# Patient Record
Sex: Female | Born: 1985
Health system: Western US, Academic
[De-identification: ages and names within clinical notes are randomized; demographics above are authoritative.]

---

## 2016-12-21 ENCOUNTER — Ambulatory Visit: Payer: BLUE CROSS/BLUE SHIELD

## 2016-12-23 ENCOUNTER — Ambulatory Visit: Payer: BLUE CROSS/BLUE SHIELD

## 2017-01-05 ENCOUNTER — Telehealth: Payer: BLUE CROSS/BLUE SHIELD

## 2017-01-06 ENCOUNTER — Telehealth: Payer: BLUE CROSS/BLUE SHIELD

## 2017-01-06 ENCOUNTER — Ambulatory Visit: Payer: BLUE CROSS/BLUE SHIELD

## 2017-01-07 ENCOUNTER — Ambulatory Visit: Payer: BLUE CROSS/BLUE SHIELD | Attending: Rheumatology

## 2017-02-05 ENCOUNTER — Ambulatory Visit: Payer: BLUE CROSS/BLUE SHIELD | Attending: Rheumatology

## 2017-02-06 ENCOUNTER — Telehealth: Payer: BLUE CROSS/BLUE SHIELD

## 2017-02-09 ENCOUNTER — Ambulatory Visit: Payer: BLUE CROSS/BLUE SHIELD | Attending: Rheumatology

## 2017-02-09 DIAGNOSIS — M3501 Sicca syndrome with keratoconjunctivitis: Secondary | ICD-10-CM

## 2017-02-09 DIAGNOSIS — G909 Disorder of the autonomic nervous system, unspecified: Secondary | ICD-10-CM

## 2017-02-09 MED ORDER — HYDROXYCHLOROQUINE SULFATE 200 MG PO TABS
200 mg | ORAL_TABLET | Freq: Every day | ORAL | 2 refills | Status: AC
Start: 2017-02-09 — End: 2018-10-12

## 2017-02-10 ENCOUNTER — Telehealth: Payer: BLUE CROSS/BLUE SHIELD

## 2017-02-12 ENCOUNTER — Telehealth: Payer: BLUE CROSS/BLUE SHIELD

## 2017-02-23 ENCOUNTER — Telehealth: Payer: BLUE CROSS/BLUE SHIELD

## 2017-02-25 ENCOUNTER — Ambulatory Visit: Payer: BLUE CROSS/BLUE SHIELD

## 2017-03-04 ENCOUNTER — Ambulatory Visit: Payer: BLUE CROSS/BLUE SHIELD | Attending: Rheumatology

## 2017-03-04 ENCOUNTER — Ambulatory Visit: Payer: BLUE CROSS/BLUE SHIELD

## 2017-03-04 DIAGNOSIS — S46811S Strain of other muscles, fascia and tendons at shoulder and upper arm level, right arm, sequela: Secondary | ICD-10-CM

## 2017-03-04 DIAGNOSIS — M549 Dorsalgia, unspecified: Secondary | ICD-10-CM

## 2017-03-04 DIAGNOSIS — S29012S Strain of muscle and tendon of back wall of thorax, sequela: Secondary | ICD-10-CM

## 2017-03-04 DIAGNOSIS — M542 Cervicalgia: Secondary | ICD-10-CM

## 2017-03-04 DIAGNOSIS — M25519 Pain in unspecified shoulder: Secondary | ICD-10-CM

## 2017-03-09 ENCOUNTER — Ambulatory Visit: Payer: BLUE CROSS/BLUE SHIELD

## 2017-03-09 ENCOUNTER — Telehealth: Payer: BLUE CROSS/BLUE SHIELD

## 2017-03-11 ENCOUNTER — Ambulatory Visit: Payer: BLUE CROSS/BLUE SHIELD

## 2017-03-11 DIAGNOSIS — M542 Cervicalgia: Secondary | ICD-10-CM

## 2017-03-11 DIAGNOSIS — F439 Reaction to severe stress, unspecified: Secondary | ICD-10-CM

## 2017-03-11 DIAGNOSIS — S46812S Strain of other muscles, fascia and tendons at shoulder and upper arm level, left arm, sequela: Secondary | ICD-10-CM

## 2017-03-11 DIAGNOSIS — M7918 Myalgia, other site: Secondary | ICD-10-CM

## 2017-03-11 DIAGNOSIS — S29012S Strain of muscle and tendon of back wall of thorax, sequela: Secondary | ICD-10-CM

## 2017-03-11 DIAGNOSIS — M549 Dorsalgia, unspecified: Secondary | ICD-10-CM

## 2017-03-11 DIAGNOSIS — S161XXS Strain of muscle, fascia and tendon at neck level, sequela: Secondary | ICD-10-CM

## 2017-03-11 DIAGNOSIS — M25519 Pain in unspecified shoulder: Secondary | ICD-10-CM

## 2017-03-18 ENCOUNTER — Ambulatory Visit: Payer: BLUE CROSS/BLUE SHIELD

## 2017-03-19 ENCOUNTER — Ambulatory Visit: Payer: BLUE CROSS/BLUE SHIELD

## 2017-03-19 ENCOUNTER — Telehealth: Payer: BLUE CROSS/BLUE SHIELD

## 2017-03-19 DIAGNOSIS — S39012A Strain of muscle, fascia and tendon of lower back, initial encounter: Secondary | ICD-10-CM

## 2017-03-19 DIAGNOSIS — M25519 Pain in unspecified shoulder: Secondary | ICD-10-CM

## 2017-03-19 DIAGNOSIS — S46911S Strain of unspecified muscle, fascia and tendon at shoulder and upper arm level, right arm, sequela: Secondary | ICD-10-CM

## 2017-03-19 DIAGNOSIS — S161XXS Strain of muscle, fascia and tendon at neck level, sequela: Secondary | ICD-10-CM

## 2017-03-19 DIAGNOSIS — F439 Reaction to severe stress, unspecified: Secondary | ICD-10-CM

## 2017-03-19 DIAGNOSIS — M542 Cervicalgia: Secondary | ICD-10-CM

## 2017-03-19 MED ORDER — IBUPROFEN 600 MG PO TABS
600 mg | ORAL_TABLET | Freq: Two times a day (BID) | ORAL | 1 refills | Status: AC | PRN
Start: 2017-03-19 — End: ?

## 2017-03-20 ENCOUNTER — Telehealth: Payer: BLUE CROSS/BLUE SHIELD

## 2017-03-23 ENCOUNTER — Ambulatory Visit: Payer: BLUE CROSS/BLUE SHIELD

## 2017-03-23 DIAGNOSIS — Z01419 Encounter for gynecological examination (general) (routine) without abnormal findings: Secondary | ICD-10-CM

## 2017-03-25 ENCOUNTER — Telehealth: Payer: BLUE CROSS/BLUE SHIELD

## 2017-03-26 ENCOUNTER — Ambulatory Visit: Payer: BLUE CROSS/BLUE SHIELD

## 2017-03-27 ENCOUNTER — Ambulatory Visit: Payer: BLUE CROSS/BLUE SHIELD

## 2017-03-27 DIAGNOSIS — M3501 Sicca syndrome with keratoconjunctivitis: Secondary | ICD-10-CM

## 2017-03-27 DIAGNOSIS — S29012S Strain of muscle and tendon of back wall of thorax, sequela: Secondary | ICD-10-CM

## 2017-03-27 DIAGNOSIS — R5383 Other fatigue: Secondary | ICD-10-CM

## 2017-03-27 DIAGNOSIS — M549 Dorsalgia, unspecified: Secondary | ICD-10-CM

## 2017-03-27 DIAGNOSIS — S46812S Strain of other muscles, fascia and tendons at shoulder and upper arm level, left arm, sequela: Secondary | ICD-10-CM

## 2017-03-27 DIAGNOSIS — F439 Reaction to severe stress, unspecified: Secondary | ICD-10-CM

## 2017-03-31 ENCOUNTER — Ambulatory Visit: Payer: BLUE CROSS/BLUE SHIELD

## 2017-04-15 ENCOUNTER — Ambulatory Visit: Payer: BLUE CROSS/BLUE SHIELD | Attending: Rheumatology

## 2017-05-04 ENCOUNTER — Ambulatory Visit: Payer: BLUE CROSS/BLUE SHIELD | Attending: Rheumatology

## 2017-05-06 ENCOUNTER — Telehealth: Payer: BLUE CROSS/BLUE SHIELD

## 2017-05-06 ENCOUNTER — Ambulatory Visit: Payer: BLUE CROSS/BLUE SHIELD | Attending: Rheumatology

## 2017-05-06 DIAGNOSIS — M3501 Sicca syndrome with keratoconjunctivitis: Secondary | ICD-10-CM

## 2017-05-15 DIAGNOSIS — F439 Reaction to severe stress, unspecified: Secondary | ICD-10-CM

## 2017-05-15 DIAGNOSIS — M2550 Pain in unspecified joint: Secondary | ICD-10-CM

## 2017-05-18 NOTE — Telephone Encounter
Message left on mobile number claim completed.

## 2017-06-03 ENCOUNTER — Ambulatory Visit: Payer: BLUE CROSS/BLUE SHIELD | Attending: Rheumatology

## 2017-08-05 ENCOUNTER — Ambulatory Visit: Payer: BLUE CROSS/BLUE SHIELD

## 2017-08-05 ENCOUNTER — Telehealth: Payer: BLUE CROSS/BLUE SHIELD

## 2017-08-05 NOTE — Telephone Encounter
This message was documented and routed to the office assigned pool during [facility] hours.     The patient or caller has been notified that this message was sent outside of normal office hours. yes    Patient or caller has been informed to please call us back if symptoms progress. No    [  ] Patient or caller has been warm transferred to the answering service/page operator.No    Good Morning Dr.Kaldas,    Patient's disability will expire Jan.2nd, however she currently has no insurance + has a large bill with [facility] and therefore is trying to avoid another visit.    Her father Passed in [place] and during that time she had surgery at [facility] due to Broken Left Hand.  She's been applying for employment that fits her needs and has not been successful + she will have insurance coverage till February 1st thru [employer] [place].    She would like to know if you could Extend her disability at this time Vs having to come in for another Doctor Appointment ??    Please Advise/letty

## 2017-08-05 NOTE — Telephone Encounter
Please advise 

## 2017-08-05 NOTE — Telephone Encounter
Fyi - Dr. Elmarie ShileyKaldas

## 2017-08-05 NOTE — Telephone Encounter
Appointment Accommodation Request    MD Name: Dr. Elmarie ShileyKaldas    Appointment Type: return    Reason for sooner request: regarding her total three patient email that was sent out today (08/05/2017), requesting to set up an appt for Medical disability  Pt would like to know if pt can be seen with Dr. Elmarie ShileyKaldas or Dr. Terance Icekookootsedes in the beginning of January    Date/Time Requested (If any): asap in January    Last seen by MD: 05/06/2017    Any Symptoms:  []  Yes  [x]  No      ? If yes, what symptoms are you experiencing:   o Duration of symptoms (how long):     Patient was offered an appointment but declined.    Patient was advised to seek emergency services if conditions are urgent or emergent.    Patient has been notified of the 24-48 hour turnaround time.

## 2017-08-05 NOTE — Telephone Encounter
Reply by: Lutricia HorsfallMarian Jera Headings  Called patient and left message that need to discuss with her symptoms and ensure we have all that is needed to extend it.

## 2017-08-06 ENCOUNTER — Ambulatory Visit: Payer: BLUE CROSS/BLUE SHIELD

## 2017-08-07 NOTE — Telephone Encounter
Please advise, see note below. Thank you

## 2017-08-07 NOTE — Telephone Encounter
Patient calling in regards to her diability, she is to speak with Dr,. Elmarie ShileyKaldas but she has missed the calls. Patient is available this morning, can doctor please call her back, thanks

## 2017-08-08 NOTE — Telephone Encounter
Reply by: Lutricia HorsfallMarian Ansen Sayegh  Will extend

## 2017-08-08 NOTE — Telephone Encounter
Reply by: Lutricia HorsfallMarian Kaleen Jensen  Called again and mailbox is full

## 2017-08-12 NOTE — Telephone Encounter
Noted, thank you

## 2017-08-20 ENCOUNTER — Ambulatory Visit: Payer: BLUE CROSS/BLUE SHIELD

## 2017-08-20 ENCOUNTER — Ambulatory Visit: Payer: BLUE CROSS/BLUE SHIELD | Attending: Foot & Ankle Surgery

## 2017-08-20 DIAGNOSIS — M549 Dorsalgia, unspecified: Secondary | ICD-10-CM

## 2017-08-20 DIAGNOSIS — F439 Reaction to severe stress, unspecified: Secondary | ICD-10-CM

## 2017-08-20 DIAGNOSIS — Z8781 Personal history of (healed) traumatic fracture: Secondary | ICD-10-CM

## 2017-08-20 DIAGNOSIS — M25519 Pain in unspecified shoulder: Secondary | ICD-10-CM

## 2017-08-20 DIAGNOSIS — M542 Cervicalgia: Secondary | ICD-10-CM

## 2017-08-21 NOTE — Progress Notes
[facility] Progress Note   Patient: Vanessa Jensen MRN: [id number] Date of Birth: 05-21-1986 Date of Service: 08/20/2017   Referring Provider: Corliss Marcus, MD, MS Primary Care Provider: Pcp, No, MD  Chief Complaint   Neck Pain    History of Present Illness   Vanessa Jensen is a 32 y.o. female who presents with Neck Pain    Interval history  08/20/17      Neck shoulder pain  Context of significant stress  Father passed aware in Sept, had to find way to pay for father's cremation  Broke left wrist in October, had to undergo surgery at [facility]  Tension built up in neck and shoulder    03/27/17  Right mid back pain, better with cupping, 5/10 pain today  Left shoulder pain  Left low back pain, 4-5/10, stiffness  Leg swelling worse at end of day  THC for sleep, but doesn't achieve deep sleep  Got posture corrector, also chrysanthemum tea    Initial intake 05/29/16  Fatigue, energy level low  Even with good sleep, 8h/night, less than 5/10  Context of insomnia for years, stopped trazadone  Uses THC, smoked, which helps  ???  Headaches since high school, saw neurologist at the time, but could not figure out the cause, diagnosed with chronic mild migraines, brain imaging negative  Since a month ago, headaches more severe, temporal, currently 4/10. Can be severe up to 8-9/10  Sharp pain, not so much throbbing  Occurring in context of neck pain  ???  Neck pain, assoc stiffness, worse over past month  Had TPI through immunologist, which helped, best relief for a couple days  Headaches improved a bit after neck pain improved  ???  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 Asthma    ??? CIN III (cervical intraepithelial neoplasia III)     s/p LEEP 12/16   ??? Depression with anxiety     prior suicide attempt   ??? History of alcohol abuse     h/o alchohol withdrawls   ??? History of eating disorder    ??? History of pancreatitis 2001   ??? History of substance abuse    ??? Insomnia    ??? Migraines    ??? Sjogren's syndrome (HCC/RAF)    ??? Urticaria      Patient Active Problem List    Diagnosis Date Noted   ??? Sjogren's syndrome (HCC/RAF) 10/08/2016     Outpatient Medications Prior to Visit   Medication Sig   ??? albuterol 90 mcg/act inhaler Take 2 puffs by nebulization.   ??? beclomethasone 40 mcg/act inhaler Take 2 puffs by nebulization.   ??? cetirizine 5 mg tablet    ??? etonogestrel-ethinyl estradiol (NUVARING) 0.12-0.015 mg/24 hr vaginal ring One ring x 3 weeks.  Remove for one week, then insert new ring and repeat cycle.   ??? fluoxetine 20 mg capsule TAKE ONE CAOSULE BY MOUTH EVERY MORNING   ???  hydroxychloroquine (PLAQUENIL) 200 mg tablet Take 1 tablet (200 mg total) by mouth daily.   ??? HYDROXYCHLOROQUINE 200 mg tablet TAKE 1 TABLET BY MOUTH EVERY DAY   ??? hydroxyzine 25 mg tablet    ??? loratadine-pseudoephedrine 10-240 mg tablet Take 1 tablet by mouth daily.   ??? lorazepam 0.5 mg tablet TAKE 1 TABLET BY MOUTH EVERY 6 HOURS AS NEEDED ANXIETY   ??? ondansetron 4 mg tablet Take 1 tablet (4 mg total) by mouth every six (6) hours as needed for Nausea.   ??? PATADAY 0.2 % ophthalmic solution    ??? trazodone 50 mg tablet TAKE 1 TO 3 TABLETS BY MOUTH AT BEDTIME AS NEEDED FOR INSOMNIA.     No facility-administered medications prior to visit.      Allergies     Allergies   Allergen Reactions   ??? Clindamycin    ??? Nitrofurantoin    ??? Tea      Green tea   ??? Fluconazole Hives   ??? Metronidazole Hives ??? Sulfa Antibiotics Hives     Review of Systems   Const:    GI:    Musculo:  Neck shoulder mid back pain  Skin:    Neuro:    Psych:  stress  Physical Examination   BP 115/74  ~ Pulse 81  ~ Temp 36.4 ???C (97.6 ???F) (Temporal)  ~ Ht 5' 3'' (1.6 m)  ~ Wt 118 lb (53.5 kg)  ~ BMI 20.90 kg/m???   General: Well-appearing, no distress.  Eyes: Eyelids normal. Conjunctiva normal.   ENMT: External ears and nose normal.   Neck: Neck symmetric.    Cardiovasc: No lower extremity edema.   Musculo: Tenderness to palpation as noted on annotated image.   Skin: Rashes none.  Psych: Mood and affect normal.  Trigger/Tender Point Identification: (see annotated image)        Laboratory studies / Diagnostics / Imaging   Obtained and reviewed old records from [facility].    Assessment / Plan   Vanessa Jensen is a 32 y.o. female with  1. Neck and shoulder pain  Acupuncture   2. Upper back pain  Acupuncture   3. Stress  Acupuncture   4. History of wrist fracture          Plan  #Neck shoulder pain  -Acupuncture today  -Recommend stretching, self massage  -TPI as needed    #Upper back pain  -Add MFR next visit    #Stress  -acupuncture  -stress mgt     #History of wrist fracture, left side    #Fatigue  -american ginseng tea  -consider melatonin for improved sleep quality    Reviewed treatments, and treatment benefits, risks, alternatives with patient. acupuncture and TPI as discussed.     Acupuncture Points:  (gb20 gb21 si15 ub11 si13 si11 ub15 ub18, R li4 li11 si5)   Acupuncture Treatment Duration: 20 Minutes                        Return in about 1 week (around 08/27/2017).     Corliss Marcus, MD, MS  08/20/2017 9:14 PM    There are no Patient Instructions on file for this visit.

## 2017-08-26 ENCOUNTER — Telehealth: Payer: BLUE CROSS/BLUE SHIELD

## 2017-08-26 NOTE — Telephone Encounter
Fully booked. Please advise on accomodation.

## 2017-08-26 NOTE — Telephone Encounter
Hi Dr. Elmarie Shiley, please see request.

## 2017-08-26 NOTE — Telephone Encounter
Call Back Request    MD:  Dr. Elmarie Shiley     Reason for call back:  Pt called to check status on extension for leave request pt had made via mychart. Please contact pt at earliest convenience.     Any Symptoms:  []  Yes  [x]  No      ? If yes, what symptoms are you experiencing:    o Duration of symptoms (how long):    o Have you taken medication for symptoms (OTC or Rx):      Patient or caller has been notified of the 24-48 hour turnaround time.

## 2017-08-26 NOTE — Telephone Encounter
Appointment Accommodation Request    MD Name:Kaldas    Appointment Type: return    Reason for sooner request:flare-up    Date/Time Requested (If any):     Last seen by MD:     Any Symptoms:  [x]  Yes  []  No      ? If yes, what symptoms are you experiencing:  Stiffness/joint pain  o Duration of symptoms (how long):     Patient was offered an appointment but declined.    Patient was advised to seek emergency services if conditions are urgent or emergent.    Patient has been notified of the 24-48 hour turnaround time.    CBN: [phone]

## 2017-08-27 ENCOUNTER — Ambulatory Visit: Payer: BLUE CROSS/BLUE SHIELD

## 2017-08-27 NOTE — Telephone Encounter
Reply by: Laquasia Pincus  done

## 2017-08-27 NOTE — Telephone Encounter
Reply by: Lutricia Horsfall  Spoke to patient. Can keep on cancellation list but ok with current appt.

## 2017-08-28 ENCOUNTER — Ambulatory Visit: Payer: BLUE CROSS/BLUE SHIELD

## 2017-09-08 ENCOUNTER — Ambulatory Visit: Payer: BLUE CROSS/BLUE SHIELD | Attending: Rheumatology

## 2017-09-08 DIAGNOSIS — M3501 Sicca syndrome with keratoconjunctivitis: Secondary | ICD-10-CM

## 2017-09-08 NOTE — Progress Notes
PATIENT: Vanessa Jensen  MRN: [id number]  DOB: [birth year]  DATE OF SERVICE: 09/08/2017  CHIEF COMPLAINT:   Chief Complaint   Patient presents with   ??? Sjogren's syndrome with keratoconjunctivitis sicca (HCC/RAF)   ??? Body Pain   ??? Joint Pain     all joints turned bright red   ??? Muscle Pain   ??? Urticaria   ??? Rash      HPI   Vanessa Jensen is a 32 y.o. female presents for the following:    09/08/17:  Joints have been aching. Had a burning redness from ankles to both knees with burning sensation.   Has been very stressed with significant life changes. Still with left wrist pain that is persistent.   Going to see specialist on Thursday. Mouth and eyes are very dry.   Integrative health coaching nutrition class online that is pretty mellow.   Recent pred taper helped a little bit.   Getting feet mris done soon.       05/06/17:  When walks on right foot feels neuropathic pain, has pain in lower back, pain in joints in hands, bilateral elbows. Feels very fatigued. Also father died last week and stress is really affecting her energy level as well. Has to push herself. At first no sleep and the last few nights was unable to get out of bed. Had recent viral infection.   10 point ROS reviewed.     03/04/17:  Saw Dr. 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 Was experiencing getting cold spells and if has chills. Gets to a point where can't breathe or talk and usually lasts about 30seconds and then resolve.   Now has been experiencing severe fatigue, dry eyes, and dry mouth. Has pain in extremities with a throb in the in distal extremities. Was taking green tea with matcha and has allergy to it.     10/08/16:   Recovered from flu and cold. Feeling a little bit better. Still with some fatigue.   10 point ROS otherwise unchanged.     08/27/16:  Has gotten the flu and a cold and has been under a lot of stress. Still with fatigue and joint pains in various areas. Still gets hives on and off. Back is uncomfortable. Lidocaine is helping with the muscle spasm.     At last visit:   Has been getting headaches since started up the prozac again. Still pretty fatigued. Coffee was making her feel bad so went to low acid coffee.     At last visit:   Got laid off while on depression leave. Feels exhausted all the time. Tried the plaquenil for about 3 weeks. The joint pains hit the knees hard sometimes. No prior injuries to knees. Biggest symptoms is the fatigue. Has been breaking out in hives. Gets blisters all over that are itchy and pop with itching. Took benadryl and it got better. Had them for about 2 months. She is not sure if there is a trigger for this.     At last visit:   Still with fatigue. Arthralgias. Feels a fog. Intermittent red rashes in sun.  Has sicca symptoms.       Initial HPI:   Started to get recurrent yeast infections with lip swelling with bleeding, cracking hives.   For past two months. Lethargic, hives on arms, headaches, dizzy spells; nausea, night sweats.   Saw immunologist and was tested for sjogren's and told has  positive ab for this. Get's raynaud's.   Feels fatigued; has insomnia (x15 years); has dry eyes; Feeling very stiff lately.  Used to drink a lot of alcohol and now cannot tolerate it. Sicca symptoms     Had recent injuries in wrist (ulnar dislocation); right wrist tendon torn; left foot collapsed arch; right foot broke toe.    Has a lot of stress in life;         PMH   Insomnia  Asthma  Pancreatitis at 14;  HPV    PSxH   Had recent injuries in wrist (ulnar dislocation); right wrist tendon torn; left foot collapsed arch; right foot broke toe.    LEEP 12/17    ALL     Allergies   Allergen Reactions   ??? Clindamycin    ??? Nitrofurantoin    ??? Tea      Green tea   ??? Fluconazole Hives   ??? Metronidazole Hives   ??? Sulfa Antibiotics Hives     MEDS     Medications that the patient states to be currently taking Medication Sig   ??? albuterol 90 mcg/act inhaler Take 2 puffs by nebulization.   ??? beclomethasone 40 mcg/act inhaler Take 2 puffs by nebulization.   ??? cetirizine 5 mg tablet    ??? doxycycline 100 mg tablet TAKE 1 TABLET BY MOUTH TWICE A DAY FOR 10 DAYS   ??? ibuprofen 200 mg tablet Take 200 mg by mouth every six (6) hours as needed.   ??? ondansetron 4 mg tablet Take 1 tablet (4 mg total) by mouth every six (6) hours as needed for Nausea.   ??? PATADAY 0.2 % ophthalmic solution        [facility]     Family History   Problem Relation Age of Onset   ??? Crohn's disease Sister         half sister     Social History   Substance Use Topics   ??? Smoking status: Current Every Day Smoker   ??? Smokeless tobacco: Never Used   ??? Alcohol use Not on file       ROS   (Check if Normal, or note + findings):     [x]  reviewed questionnaire on 09/08/2017    []  Not Obtainable:   []  Constit  []  Skin    []  Eyes  []  CV   []  MS    []  ENT  []  GI  []  Heme/Lymph     []  Resp  []  GU  []  Neuro    []  Imm/All   []  Endo  []  Psych     PHYSICAL EXAM   Check if Normal, or note + findings  Vit BP 110/62  ~ Pulse 65  ~ Temp 36.6 ???C (97.9 ???F)  ~ Ht 5' 3'' (1.6 m)  ~ Wt 121 lb (54.9 kg)  ~ SpO2 98%  ~ BMI 21.43 kg/m???    Gen [x]  NAD  GI []  Abd  []  Rect []  HSM   Eyes [x]  Conj/Lids []  Pupils  []  Masses []  Guard/Rebnd   ENT []  Ears/otosc    []  Oroph   []  Nasal Muc  []  Hearing GU:Fem  ZO:XWRU []  Ext   []  Vag      []  Pen  []  Scro    []  Cerv []  Uter/Ad []  Deferred  []  Prost []  Deferred   Neck []  Inspect/Palp   []  Thyroid Neuro [x]  A&O []  CN2-12   Breast []  Inspect   []   Palpation  []  DTR []  Motor/Sen   Resp [x]  Effort [x]  Ascult []  Percuss MSK [x]  Gait   [x]  ROM  []  Tone  []  Bal []  Insp/palp  []  Back   CV [x]  Auscultate []  Carotids       [x]  Edema Puls: []  Pedal []  Fem  Skin [x]  Inspect  Healed scar on lateral part of wrist. Ventral wrist and forearm scar.  []  Palp   Lymph [x]  Neck  []  Axillary  []   Femoral Psych [x]  Insight/judg  []  Affect []  Cog      28 joints examined ~~~~~~~~~~~~~~~~~~~~~~~~~~~~~~~~~~~~~~~~~~~~~~~~~~~~~~~~  RIGHT  Tender Joint Count:   LEFT   TMJ []   ~ []  TMJ   Shoulder []  ~ []  Shoulder   Elbow []  ~ []  Elbow   Wrist []  ~ [x]  Wrist   CMC1 []  ~ []  CMC1  ~  []  MCP5 []  MCP4 []  MCP3 []  MCP2 []  MCP1 ~ []  MCP1 []  MCP2 []  MCP3 []  MCP4 []  MCP5  []  PIP5 []  PIP4 []  PIP3 []  PIP2 []  IP1 ~ []  IP1 []  PIP2 []  PIP3 []  PIP4 []  PIP5   []  DIP5 []  DIP4 []  DIP3 []  DIP2~  ~  []  DIP2 []  DIP3 []  DIP4 []  DIP5                 []  cervical spine                 []  thoracic spine                 []  lumbar spine      Sacroiliac  []  ~       []  Sacroiliac                Hip  []  ~       []  Hip                  Knee  []  ~       []  Knee        Ankle    []  ~       []  Ankle      []  MTP5 []  MTP4 []  MTP3 []  MTP2 []  MTP1 ~ []  MTP1 []  MTP2 []  MTP3 []  MTP4 []  MCP5  []  Toe5 []  Toe4 []  Toe3 []  Toe2 []  Toe1 ~ []  Toe1 []  Toe2 []  Toe3 []  Toe4 []  Toe5     ~~~~~~~~~~~~~~~~~~~~~~~~~~~~~~~~~~~~~~~~~~~~~~~~~~~~~~~~   RIGHT  Swollen Joint Count:   LEFT   TMJ []   ~ []  TMJ   Shoulder []  ~ []  Shoulder   Elbow []  ~ []  Elbow   Wrist []  ~ [x]  Wrist   CMC1 []  ~ []  CMC1  ~  []  MCP5 []  MCP4 []  MCP3 []  MCP2 []  MCP1 ~ []  MCP1 []  MCP2 []  MCP3 []  MCP4 []  MCP5  []  PIP5 []  PIP4 []  PIP3 []  PIP2 []  IP1 ~ []  IP1 []  PIP2 []  PIP3 []  PIP4 []  PIP5   []  DIP5 []  DIP4 []  DIP3 []  DIP2~  ~  []  DIP2 []  DIP3 []  DIP4 []  DIP5              Sacroiliac  []  ~       []  Sacroiliac                                          Knee  []  ~       []   Knee       Ankle    []  ~       []  Ankle  []  MTP5 []  MTP4 []  MTP3 []  MTP2 []  MTP1 ~ []  MTP1 []  MTP2 []  MTP3 []  MTP4 []  MCP5  []  Toe5 []  Toe4 []  Toe3 []  Toe2 []  Toe1 ~ []  Toe1 []  Toe2 []  Toe3 []  Toe4 []  Toe5     Recent Labs and Test Results:     Lab Results   Component Value Date    CRP 0.3 02/09/2017    CRP <0.3 10/01/2016    SRWEST 9 02/09/2017    SRWEST 0 04/30/2016     Lab Results   Component Value Date    WBC 7.86 02/09/2017    HGB 13.7 02/09/2017    HCT 41.4 02/09/2017    PLT 311 02/09/2017 NEUTABS 5.52 02/09/2017    LYMPHABS 1.73 02/09/2017     Lab Results   Component Value Date    GLUCOSE 118 (H) 02/09/2017    CREAT 0.74 02/09/2017    CALCIUM 10.3 02/09/2017    TOTPRO 8.3 (H) 02/09/2017    ALBUMIN 4.9 02/09/2017    AST 26 02/09/2017    ALT 23 02/09/2017    ALKPHOS 48 02/09/2017     Lab Results   Component Value Date    CKTOT 87 04/30/2016     Lab Results   Component Value Date    C3 117 02/09/2017    C3 111 04/30/2016    C4 24 02/09/2017    C4 21 04/30/2016    DSDNAAB <=200 04/30/2016    DSDNAAB <=200 11/23/2015    NDNAABIFA <1:10 04/30/2016     No results found for: CANCA, PANCA, PROT3AB, MPOAB  No results found for: PROTCLUR, BLDUR, LEUKESTUR, RBCSUR, WBCSUR  Lab Results   Component Value Date    VITD25OH 48 08/27/2016    VITD25OH 74 (H) 04/30/2016     Outside labs reviewed  ANCA-neg  ANA IFA-neg  SSA-strong pos  ssb-neg      A&P     # sjogren's ab +, arthralgia, raynaud's, fatigue, low grade fevers and itching with intermittent hives.   -serologies-negative except for positive SSA.   - conservative management of dry eyes and dry mouth discussed  -sun avoidance  Labs today to follow up.   --referral to [facility] at last visit.     Continue plaquenil.   Finished steroid taper  OTC products for the dryness     Still would benefit from stress reduction and decreased physical stressors.     Follow up with hand specialist    Given ongoing work up for hand pain and foot discomfort as well as current flare and stressors think extension off work would be appropriate.        RTC 2 months    The above recommendation were discussed with the patient.  The patient has all questions answered satisfactorily and is in agreement with this recommended plan of care.  Author:  Lutricia Horsfall 09/08/2017 10:40 AM

## 2017-09-09 ENCOUNTER — Ambulatory Visit: Payer: BLUE CROSS/BLUE SHIELD

## 2017-09-09 LAB — Comprehensive Metabolic Panel
ALBUMIN: 4.2 g/dL (ref 3.9–5.0)
TOTAL PROTEIN: 6.9 g/dL (ref 6.1–8.2)

## 2017-09-09 LAB — Sedimentation Rate, Erythrocyte: SEDIMENTATION RATE, ERYTHROCYTE: 4 mm/h (ref <=25)

## 2017-09-09 LAB — PROTEIN/CREATININE RATIO, URINE: PROTEIN,URINE: <4 mg/dL (ref 0.0–0.4)

## 2017-09-09 LAB — C-Reactive Protein: C-REACTIVE PROTEIN: <0.3 mg/dL (ref <0.8)

## 2017-09-09 LAB — C4: C4: 21 mg/dL (ref 14–46)

## 2017-09-09 LAB — C3: C3: 94 mg/dL (ref 76–165)

## 2017-09-09 LAB — Pregnancy Test,Blood: PREGNANCY TEST,BLOOD: NEGATIVE

## 2017-09-09 LAB — Differential Automated: NEUTROPHIL PERCENT, AUTO: 57.3 (ref 0.00–0.04)

## 2017-09-09 LAB — CBC: MEAN PLATELET VOLUME: 10.7 fL (ref 9.3–13.0)

## 2017-09-09 NOTE — Progress Notes
PATIENT: Vanessa Jensen  MRN: [id number]  DOB: 1985-11-12  DATE OF SERVICE: 09/11/2017    PRIMARY CARE PROVIDER: Sharen Counter., MD    CHIEF COMPLAINT: No chief complaint on file.       Subjective:      Vanessa Jensen is a 32 y.o. female h/o as stated below p/w No chief complaint on file.   and the following current concerns.      Current Concerns:  ***    Chronic Problems:   Patient Active Problem List   Diagnosis   ??? Sjogren's syndrome (HCC/RAF)       Current Medications:   Current Outpatient Prescriptions   Medication Sig   ??? albuterol 90 mcg/act inhaler Take 2 puffs by nebulization.   ??? beclomethasone 40 mcg/act inhaler Take 2 puffs by nebulization.   ??? cetirizine 5 mg tablet    ??? doxycycline 100 mg tablet TAKE 1 TABLET BY MOUTH TWICE A DAY FOR 10 DAYS   ??? etonogestrel-ethinyl estradiol (NUVARING) 0.12-0.015 mg/24 hr vaginal ring One ring x 3 weeks.  Remove for one week, then insert new ring and repeat cycle.   ??? fluoxetine 20 mg capsule TAKE ONE CAOSULE BY MOUTH EVERY MORNING   ??? hydroxychloroquine (PLAQUENIL) 200 mg tablet Take 1 tablet (200 mg total) by mouth daily. (Patient not taking: Reported on 09/08/2017.)   ??? HYDROXYCHLOROQUINE 200 mg tablet TAKE 1 TABLET BY MOUTH EVERY DAY (Patient not taking: Reported on 09/08/2017)   ??? hydroxyzine 25 mg tablet    ??? ibuprofen 200 mg tablet Take 200 mg by mouth every six (6) hours as needed.   ??? loratadine-pseudoephedrine 10-240 mg tablet Take 1 tablet by mouth daily. (Patient not taking: Reported on 09/08/2017.)   ??? lorazepam 0.5 mg tablet TAKE 1 TABLET BY MOUTH EVERY 6 HOURS AS NEEDED ANXIETY   ??? ondansetron 4 mg tablet Take 1 tablet (4 mg total) by mouth every six (6) hours as needed for Nausea.   ??? PATADAY 0.2 % ophthalmic solution    ??? trazodone 50 mg tablet TAKE 1 TO 3 TABLETS BY MOUTH AT BEDTIME AS NEEDED FOR INSOMNIA.     No current facility-administered medications for this visit.         Allergies: Clindamycin; Nitrofurantoin; Tea; Fluconazole; Metronidazole; and Sulfa antibiotics    Past Surgical History:   Procedure Laterality Date   ??? CERVICAL BIOPSY  W/ LOOP ELECTRODE EXCISION  2006, 07/19/2015   ??? DILATION AND CURETTAGE OF UTERUS     ??? TOE SURGERY  08/2014   ??? WRIST SURGERY  03/2015    dislocated ulna, torn tendon       Family History   Problem Relation Age of Onset   ??? Crohn's disease Sister         half sister       Social History     Social History   ??? Marital status: Single     Spouse name: N/A   ??? Number of children: N/A   ??? Years of education: N/A     Occupational History   ??? Not on file.     Social History Main Topics   ??? Smoking status: Current Every Day Smoker   ??? Smokeless tobacco: Never Used   ??? Alcohol use Not on file   ??? Drug use: Yes     Types: Marijuana   ??? Sexual activity: Not on file     Other Topics Concern   ??? Not on file  Social History Narrative   ??? No narrative on file       Review of Systems: A 14-system review of systems was performed and is negative except as stated in the history of present illness.  Constitutional: negative for chills, fevers, night sweats and weight loss  ENT: negative bad breath  Eyes: negative discharge from eyes  Respiratory: negative for cough, dyspnea on exertion and wheezing  Cardiovascular: negative for chest pain, dyspnea, lower extremity edema and palpitations  GI: negative nausea, vomiting, diarrhea  GU: no urinary incontinence  MSK: negative muscle twitching  Integumentary: negative cracking of skin  Neurological: negative syncope or disorientation  Psychiatric: negative nervousness  Endocrine: negative excessive thirst  Heme/Lymph: negative enlarged lymph nodes  Allergic/Immunologic: negative hives       Objective:        There were no vitals taken for this visit.    General: WN,WD in NAD, cooperative  Head: NC/AT  Eyes: EOMI, conjunctiva clear  Ears: normal TMs and external ear canals both ears  Nose: nares normal, no drainage, sinus tenderness  Throat: OP without erythema, no exudates Neck: no LAD, supple, trachea midline, thyroid not enlarged, symmetric, no tenderness/mass/nodules  Lungs: CTAB, no w/r/r  Heart: RRR, S1 and S2 normal, no m/r/g  Abdomen: Soft, NT/ND, no organomegaly, +BS  Back: no midline TTP, no CVAT  Extremities: Symmetric, no c/c/e  Pulses: 2+ symmetric: dorsal pedis  Neuro: Grossly intact  Psychiatric: answers appropriately, mood and affect WNLs    Lab Review:   Office Visit on 09/08/2017   Component Date Value   ??? Sodium 09/08/2017 143    ??? Potassium 09/08/2017 4.5    ??? Chloride 09/08/2017 107*   ??? Total CO2 09/08/2017 23    ??? Anion Gap 09/08/2017 13    ??? Glucose 09/08/2017 95    ??? GFR Estimate for Non-Afr* 09/08/2017 >89    ??? GFR Estimate for African* 09/08/2017 >89    ??? GFR Additional Informati* 09/08/2017 See Comment    ??? Creatinine 09/08/2017 0.64    ??? Urea Nitrogen 09/08/2017 15    ??? Calcium 09/08/2017 9.1    ??? Total Protein 09/08/2017 6.9    ??? Albumin 09/08/2017 4.2    ??? Bilirubin,Total 09/08/2017 0.3    ??? Alkaline Phosphatase 09/08/2017 65    ??? Aspartate Aminotransfera* 09/08/2017 14    ??? Alanine Aminotransferase 09/08/2017 14    ??? Sedimentation rate, Eryt* 09/08/2017 4    ??? C-Reactive Protein 09/08/2017 <0.3    ??? Prot/Creat Ratio,Ur 09/08/2017     ??? Protein,Urine 09/08/2017 <4    ??? Creatinine,Random Urine 09/08/2017 56.9    ??? C3 09/08/2017 94    ??? C4 09/08/2017 21    ??? Pregnancy Test,Blood 09/08/2017 Negative    ??? White Blood Cell Count 09/08/2017 6.53    ??? Red Blood Cell Count 09/08/2017 3.64*   ??? Hemoglobin 09/08/2017 11.3*   ??? Hematocrit 09/08/2017 33.7*   ??? Mean Corpuscular Volume 09/08/2017 92.6    ??? Mean Corpuscular Hemoglo* 09/08/2017 31.0    ??? MCH Concentration 09/08/2017 33.5    ??? Red Cell Distribution Wi* 09/08/2017 43.6    ??? Red Cell Distribution Wi* 09/08/2017 13.1    ??? Platelet Count, Auto 09/08/2017 229    ??? Mean Platelet Volume 09/08/2017 10.7    ??? Nucleated RBC%, automated 09/08/2017 0.0    ??? Absolute Nucleated RBC C* 09/08/2017 0.00 ??? Neutrophil Abs (Prelim) 09/08/2017 3.74    ??? Neutrophil Percent, Auto  09/08/2017 57.3    ??? Lymphocyte Percent, Auto 09/08/2017 28.0    ??? Monocyte Percent, Auto 09/08/2017 10.1    ??? Eosinophil Percent, Auto 09/08/2017 3.7    ??? Basophil Percent, Auto 09/08/2017 0.6    ??? Immature Granulocytes% 09/08/2017 0.3    ??? Absolute Neut Count 09/08/2017 3.74    ??? Absolute Lymphocyte Count 09/08/2017 1.83    ??? Absolute Mono Count 09/08/2017 0.66    ??? Absolute Eos Count 09/08/2017 0.24    ??? Absolute Baso Count 09/08/2017 0.04    ??? Absolute Immature Gran C* 09/08/2017 0.02            Assessment & Plan:          32 y.o. old female  has a past medical history of Asthma; CIN III (cervical intraepithelial neoplasia III); Depression with anxiety; History of alcohol abuse; History of eating disorder; History of pancreatitis (2001); History of substance abuse; Insomnia; Migraines; Sjogren's syndrome (HCC/RAF); and Urticaria. had no chief complaint listed for this encounter., ***    There are no diagnoses linked to this encounter.          #Routine Health Maintenance:  Health Maintenance Topics with due status: Not Due       Topic Last Completion Date    Tdap/Td Vaccine 02/28/2014    Cervical Ca Screening: PAP Smear 03/23/2017     Health Maintenance Topics with due status: Completed / Addressed / Aged Out       Topic Last Completion Date    HIV Screening 03/23/2017       Health Maintenance Due   Topic Date Due   ??? Cervical Ca Screening: HPV Testing  11/09/2016   ??? Influenza Vaccine (1) 04/18/2017       Mammogram: No results found. n/a  Pap: Neg 03/23/17  Colon CA: n/a  Immunizations: Flu: *** Tdap: 2015 Pneumo n/a Zoster n/a  DEXA: n/a  Hx of Smoking/Abd Ultrasound  (M>65yo any smoking)/CTscan (M/F 30pack year and smoked past 15 yrs): n/a  HCV: n/a    Follow up:   No Follow-up on file.          Author:  Sharen Counter, MD 09/09/2017 1:16 PM      The above plan of care, diagnosis, orders, and follow-up were discussed with the patient.  Questions related to this recommended plan of care were answered.

## 2017-09-10 LAB — dsDNA Ab EIA: DSDNA AB EIA: <=200 [IU]/mL (ref <=200)

## 2017-09-11 ENCOUNTER — Ambulatory Visit: Payer: BLUE CROSS/BLUE SHIELD

## 2017-09-11 DIAGNOSIS — M76829 Posterior tibial tendinitis, unspecified leg: Secondary | ICD-10-CM

## 2017-09-12 ENCOUNTER — Inpatient Hospital Stay: Payer: BLUE CROSS/BLUE SHIELD

## 2017-09-12 LAB — dsDNA Ab IFA: NDNA (CRITHIDIA) AB IFA: <1:10 {titer}

## 2017-09-14 ENCOUNTER — Ambulatory Visit: Payer: BLUE CROSS/BLUE SHIELD | Attending: Rheumatology

## 2017-09-14 ENCOUNTER — Ambulatory Visit: Payer: BLUE CROSS/BLUE SHIELD

## 2017-09-24 ENCOUNTER — Ambulatory Visit: Payer: BLUE CROSS/BLUE SHIELD

## 2017-09-24 MED ORDER — ALBUTEROL SULFATE HFA 108 (90 BASE) MCG/ACT IN AERS
2 | Freq: Four times a day (QID) | RESPIRATORY_TRACT | 1 refills | Status: AC | PRN
Start: 2017-09-24 — End: 2017-12-03

## 2017-09-29 ENCOUNTER — Telehealth: Payer: BLUE CROSS/BLUE SHIELD

## 2017-09-29 ENCOUNTER — Ambulatory Visit: Payer: BLUE CROSS/BLUE SHIELD

## 2017-09-29 NOTE — Telephone Encounter
Message from [facility], please advise.     ''  I had STD testing done in August 2018 and I would like to check the details as soon as possible please. Please call me at [phone]

## 2017-09-29 NOTE — Telephone Encounter
Results Request - The patient would like to discuss the results of their recent tests.     Pt would like to discuss test results for labs taken back in 03/23/17. Pt recently had lab work done with different MD and was advised something came up in test (Pt did not want to disclose) and she would like to go over previous labs to pin point time frame.     1) Ordering MD: Genevieve Norlander    2) What type of test(s)? Labs    3) When was it performed? 03/23/17    4) Where was it performed?     If [facility], are results available in [facility]? Yes  If outside facility, what is their phone number?     Patient has been notified of the 24-48 hour turnaround time.

## 2017-09-30 ENCOUNTER — Ambulatory Visit: Payer: BLUE CROSS/BLUE SHIELD

## 2017-10-19 ENCOUNTER — Ambulatory Visit: Payer: BLUE CROSS/BLUE SHIELD | Attending: Hand Surgery

## 2017-10-24 IMAGING — CR DX Chest PA Lateral
1 series · 2 of 2 positions shown · non-contrast
Comparison: July 01, 2005

HISTORY: Cough
TECHNIQUE: Frontal and lateral chest radiographs.

[Series 1: pa · 0.17mm/px · 2 of 2 slices shown]
[im 1/2]
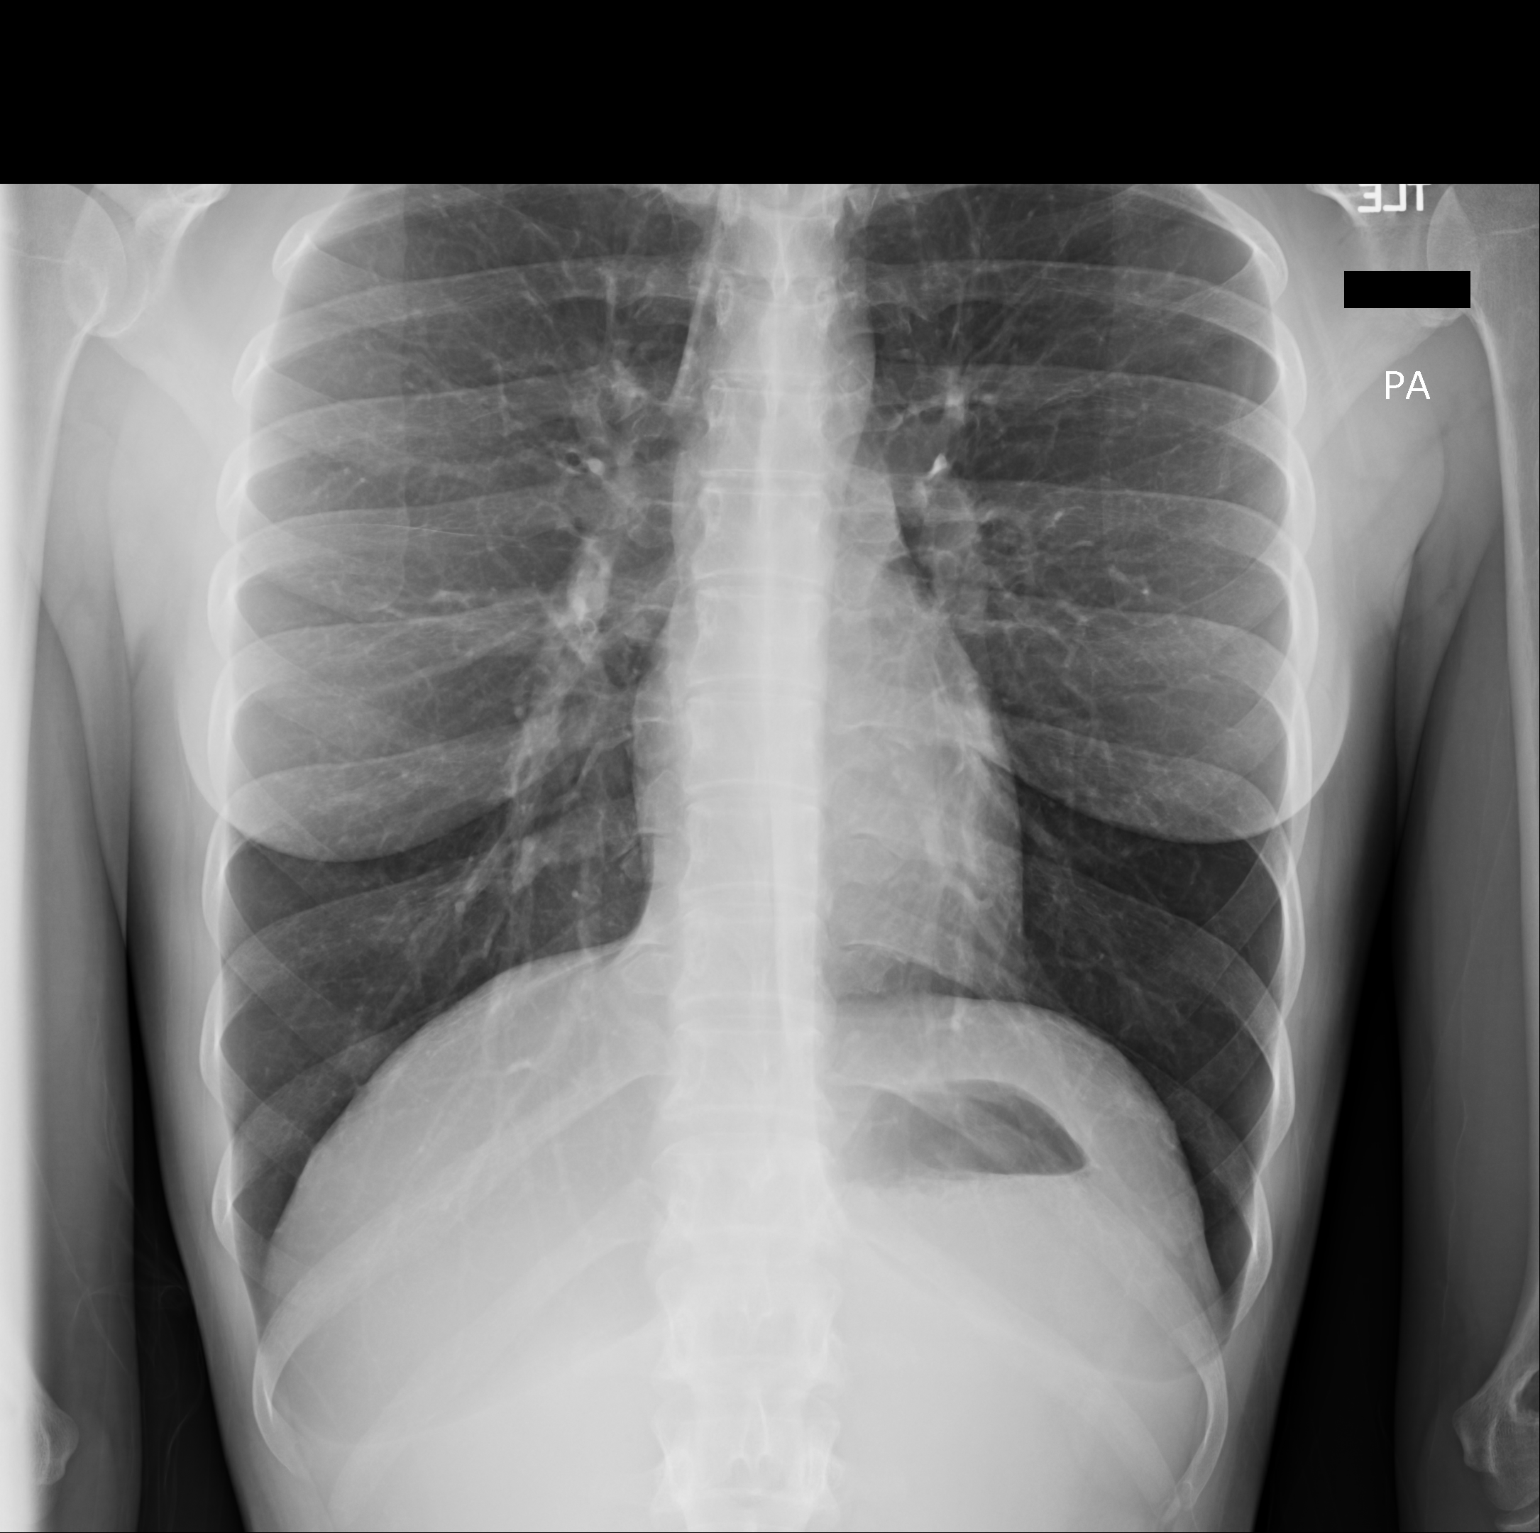
[im 2/2]
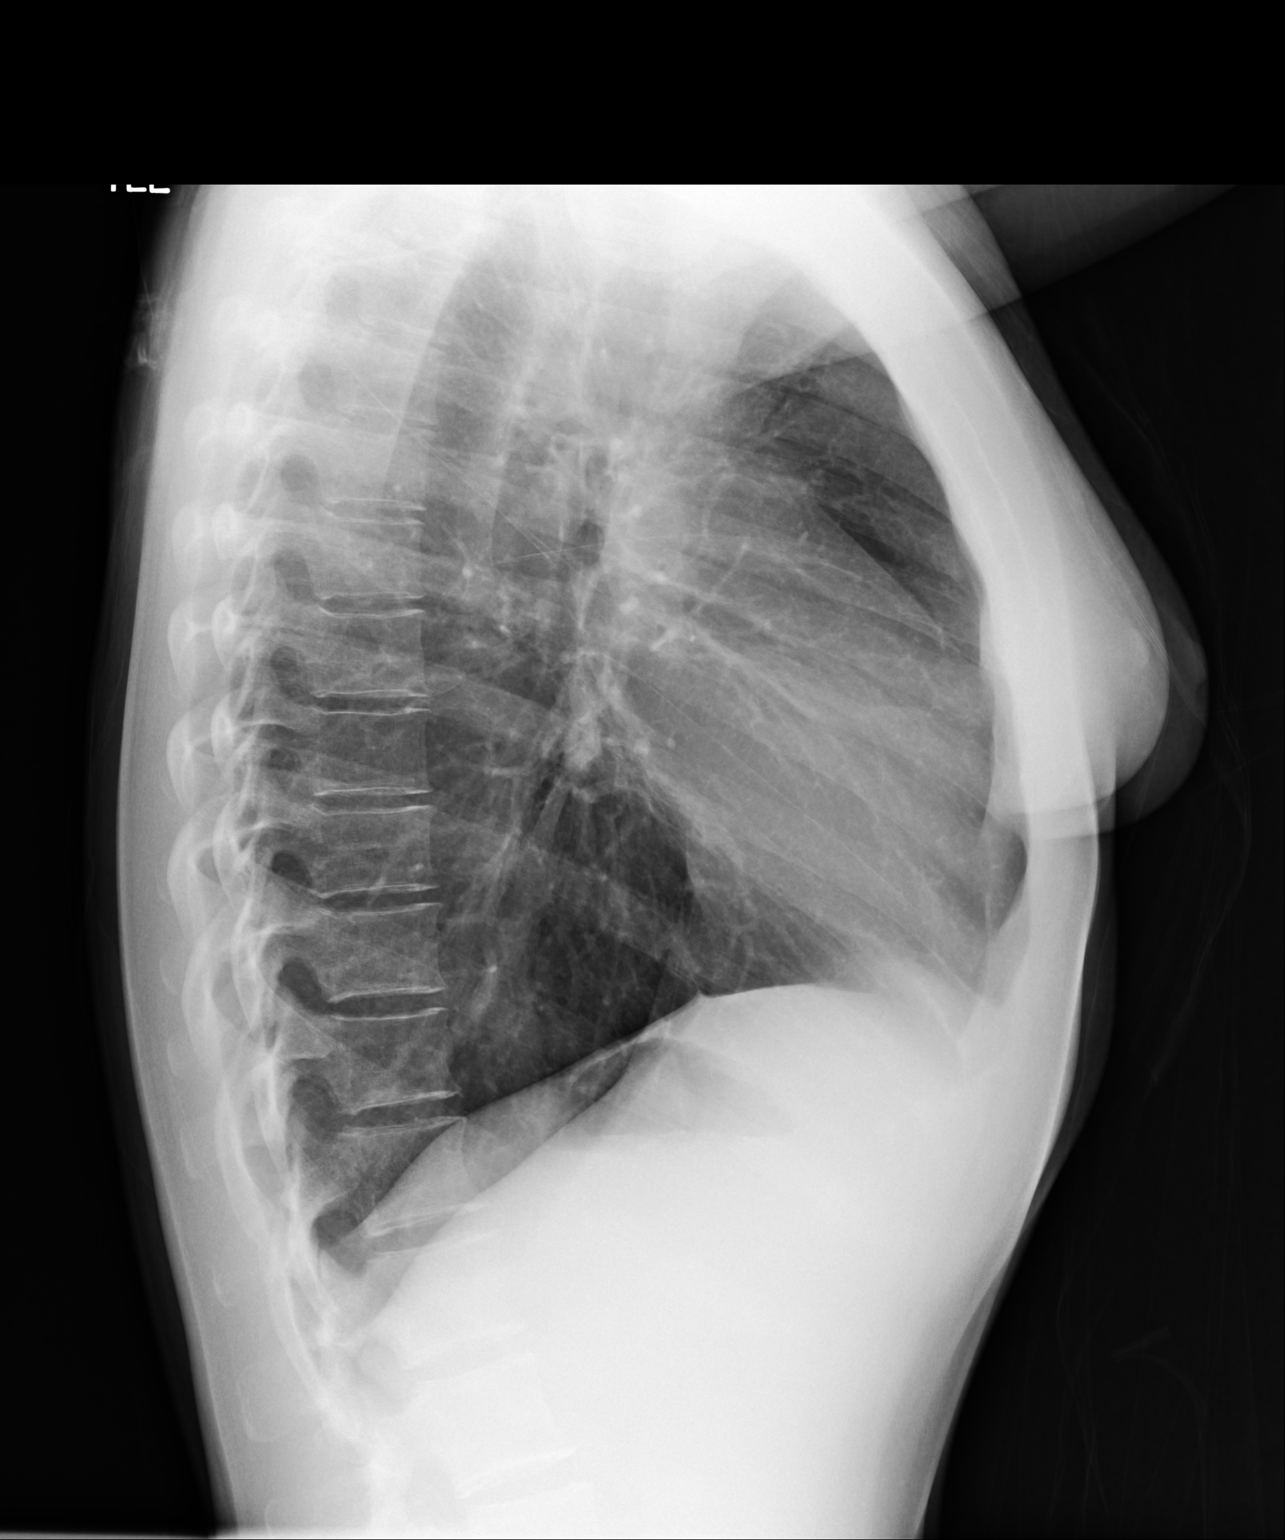

[2 of 2 positions shown; findings below may reference images not displayed]

FINDINGS: The cardiomediastinal and hilar silhouettes appear normal.  Lungs are adequately          
 expanded and clear without focal consolidation, pleural effusion,                         
 or                                                                                        
 pneumothorax.
IMPRESSION: 1.  No acute abnormality.

## 2017-10-28 ENCOUNTER — Inpatient Hospital Stay: Payer: BLUE CROSS/BLUE SHIELD | Attending: Hand Surgery

## 2017-10-28 ENCOUNTER — Ambulatory Visit: Payer: BLUE CROSS/BLUE SHIELD | Attending: Rheumatology

## 2017-10-28 DIAGNOSIS — M25831 Other specified joint disorders, right wrist: Secondary | ICD-10-CM

## 2017-10-28 MED ORDER — MELOXICAM 7.5 MG PO TABS
7.5 mg | ORAL_TABLET | Freq: Every day | ORAL | 2 refills | Status: AC
Start: 2017-10-28 — End: 2018-04-24

## 2017-10-28 NOTE — Patient Instructions
Sterling Big MA MFT  [address] [facility] [address], [place] [address]  License:   Phone:  Fax:   [place] [address]  [phone]  [phone]  [email or web address]

## 2017-10-28 NOTE — Progress Notes
PATIENT: Vanessa Jensen  MRN: [id number]  DOB: 14-Jan-1986  DATE OF SERVICE: 10/28/2017  CHIEF COMPLAINT:   Chief Complaint   Patient presents with   ??? Sjogren's syndrome with keratoconjunctivitis sicca (HCC/RAF)   ??? Joint Pain   ??? Arm Pain   ??? Fatigue   ??? Chest congestion      HPI   Vanessa Jensen is a 32 y.o. female presents for the following:    10/28/17:  Fatigued. Has been having difficulty sleeping. Grandmother died. Almost died drinking and went to detox at [facility]. Was placed on a lot of psychiatric meds. Was placed on keppra, ativan, gabapentin, abilify.   Currently in AA and doing it.    Staying with friend and wife now. Was never placed on inpatient.   Had recent respiratory infection and was just given prednisone for respiratory infection.   cbd drops currently. Left foot collapsed arch and it is very painful.         09/08/17:  Joints have been aching. Had a burning redness from ankles to both knees with burning sensation.   Has been very stressed with significant life changes. Still with left wrist pain that is persistent.   Going to see specialist on Thursday. Mouth and eyes are very dry.   Integrative health coaching nutrition class online that is pretty mellow.   Recent pred taper helped a little bit.   Getting feet mris done soon.       05/06/17:  When walks on right foot feels neuropathic pain, has pain in lower back, pain in joints in hands, bilateral elbows. Feels very fatigued. Also father died last week and stress is really affecting her energy level as well. Has to push herself. At first no sleep and the last few nights was unable to get out of bed. Had recent viral infection.   10 point ROS reviewed.     03/04/17:  Saw Dr. 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 Was experiencing getting cold spells and if has chills. Gets to a point where can't breathe or talk and usually lasts about 30seconds and then resolve. Now has been experiencing severe fatigue, dry eyes, and dry mouth. Has pain in extremities with a throb in the in distal extremities. Was taking green tea with matcha and has allergy to it.     10/08/16:   Recovered from flu and cold. Feeling a little bit better. Still with some fatigue.   10 point ROS otherwise unchanged.     08/27/16:  Has gotten the flu and a cold and has been under a lot of stress. Still with fatigue and joint pains in various areas. Still gets hives on and off. Back is uncomfortable. Lidocaine is helping with the muscle spasm.     At last visit:   Has been getting headaches since started up the prozac again. Still pretty fatigued. Coffee was making her feel bad so went to low acid coffee.     At last visit:   Got laid off while on depression leave. Feels exhausted all the time. Tried the plaquenil for about 3 weeks. The joint pains hit the knees hard sometimes. No prior injuries to knees. Biggest symptoms is the fatigue. Has been breaking out in hives. Gets blisters all over that are itchy and pop with itching. Took benadryl and it got better. Had them for about 2 months. She is not sure if there is a trigger for  this.     At last visit:   Still with fatigue. Arthralgias. Feels a fog. Intermittent red rashes in sun.  Has sicca symptoms.       Initial HPI:   Started to get recurrent yeast infections with lip swelling with bleeding, cracking hives.   For past two months. Lethargic, hives on arms, headaches, dizzy spells; nausea, night sweats.   Saw immunologist and was tested for sjogren's and told has positive ab for this. Get's raynaud's.   Feels fatigued; has insomnia (x15 years); has dry eyes; Feeling very stiff lately.  Used to drink a lot of alcohol and now cannot tolerate it. Sicca symptoms     Had recent injuries in wrist (ulnar dislocation); right wrist tendon torn; left foot collapsed arch; right foot broke toe.    Has a lot of stress in life;         PMH   Insomnia  Asthma Pancreatitis at 14;  HPV    PSxH   Had recent injuries in wrist (ulnar dislocation); right wrist tendon torn; left foot collapsed arch; right foot broke toe.    LEEP 12/17    ALL     Allergies   Allergen Reactions   ??? Clindamycin    ??? Nitrofurantoin    ??? Tea      Green tea   ??? Fluconazole Hives   ??? Metronidazole Hives   ??? Sulfa Antibiotics Hives     MEDS     Medications that the patient states to be currently taking   Medication Sig   ??? albuterol 90 mcg/act inhaler Inhale 2 puffs every six (6) hours as needed.   ??? beclomethasone 40 mcg/act inhaler Take 2 puffs by nebulization.   ??? cetirizine 5 mg tablet    ??? fluoxetine 20 mg capsule TAKE ONE CAOSULE BY MOUTH EVERY MORNING   ??? hydroxyzine 25 mg tablet    ??? ibuprofen 200 mg tablet Take 200 mg by mouth every six (6) hours as needed.   ??? ibuprofen 600 mg tablet    ??? loratadine-pseudoephedrine 10-240 mg tablet Take 1 tablet by mouth daily.   ??? mirtazapine 15 mg tablet Take 15 mg by mouth.   ??? mirtazapine 15 mg tablet TAKE 1 TABLET BY MOUTH EVERY DAY AT NIGHT   ??? ondansetron 4 mg tablet Take 1 tablet (4 mg total) by mouth every six (6) hours as needed for Nausea.   ??? predniSONE 20 mg tablet        [facility]     Family History   Problem Relation Age of Onset   ??? Crohn's disease Sister         half sister     Social History   Substance Use Topics   ??? Smoking status: Current Every Day Smoker   ??? Smokeless tobacco: Never Used   ??? Alcohol use Not on file       ROS   (Check if Normal, or note + findings):     [x]  reviewed questionnaire on 10/28/2017    []  Not Obtainable:   []  Constit  []  Skin    []  Eyes  []  CV   []  MS    []  ENT  []  GI  []  Heme/Lymph     []  Resp  []  GU  []  Neuro    []  Imm/All   []  Endo  []  Psych     PHYSICAL EXAM   Check if Normal, or note + findings  Vit BP 146/71  ~  Pulse 93  ~ Temp 37.1 ???C (98.7 ???F)  ~ Ht 5' 3'' (1.6 m)  ~ Wt 114 lb (51.7 kg)  ~ SpO2 97%  ~ BMI 20.19 kg/m???    Gen [x]  NAD  GI []  Abd  []  Rect []  HSM Eyes [x]  Conj/Lids []  Pupils  []  Masses []  Guard/Rebnd   ENT []  Ears/otosc    []  Oroph   []  Nasal Muc  []  Hearing GU:Fem  ZO:XWRU []  Ext   []  Vag      []  Pen  []  Scro    []  Cerv []  Uter/Ad []  Deferred  []  Prost []  Deferred   Neck []  Inspect/Palp   []  Thyroid Neuro [x]  A&O []  CN2-12   Breast []  Inspect   []  Palpation  []  DTR []  Motor/Sen   Resp [x]  Effort [x]  Ascult []  Percuss MSK [x]  Gait   [x]  ROM  []  Tone  []  Bal []  Insp/palp  []  Back   CV [x]  Auscultate []  Carotids       [x]  Edema Puls: []  Pedal []  Fem  Skin [x]  Inspect  Healed scar on lateral part of wrist. Ventral wrist and forearm scar.  []  Palp   Lymph [x]  Neck  []  Axillary  []   Femoral Psych [x]  Insight/judg  []  Affect []  Cog      28 joints examined   ~~~~~~~~~~~~~~~~~~~~~~~~~~~~~~~~~~~~~~~~~~~~~~~~~~~~~~~~  RIGHT  Tender Joint Count:   LEFT   TMJ []   ~ []  TMJ   Shoulder []  ~ []  Shoulder   Elbow []  ~ []  Elbow   Wrist []  ~ [x]  Wrist   CMC1 []  ~ []  CMC1  ~  []  MCP5 []  MCP4 []  MCP3 []  MCP2 []  MCP1 ~ []  MCP1 []  MCP2 []  MCP3 []  MCP4 []  MCP5  []  PIP5 []  PIP4 []  PIP3 []  PIP2 []  IP1 ~ []  IP1 []  PIP2 []  PIP3 []  PIP4 []  PIP5   []  DIP5 []  DIP4 []  DIP3 []  DIP2~  ~  []  DIP2 []  DIP3 []  DIP4 []  DIP5                 []  cervical spine                 []  thoracic spine                 []  lumbar spine      Sacroiliac  []  ~       []  Sacroiliac                Hip  []  ~       []  Hip                  Knee  []  ~       []  Knee        Ankle    []  ~       []  Ankle      []  MTP5 []  MTP4 []  MTP3 []  MTP2 []  MTP1 ~ []  MTP1 []  MTP2 []  MTP3 []  MTP4 []  MCP5  []  Toe5 []  Toe4 []  Toe3 []  Toe2 []  Toe1 ~ []  Toe1 []  Toe2 []  Toe3 []  Toe4 []  Toe5     ~~~~~~~~~~~~~~~~~~~~~~~~~~~~~~~~~~~~~~~~~~~~~~~~~~~~~~~~   RIGHT  Swollen Joint Count:   LEFT   TMJ []   ~ []  TMJ   Shoulder []  ~ []  Shoulder   Elbow []  ~ []  Elbow   Wrist []  ~ [x]  Wrist   CMC1 []  ~ []  CMC1  ~  []   MCP5 []  MCP4 []  MCP3 []  MCP2 []  MCP1 ~ []  MCP1 []  MCP2 []  MCP3 []  MCP4 []  MCP5 []  PIP5 []  PIP4 []  PIP3 []  PIP2 []  IP1 ~ []  IP1 []  PIP2 []  PIP3 []  PIP4 []  PIP5   []  DIP5 []  DIP4 []  DIP3 []  DIP2~  ~  []  DIP2 []  DIP3 []  DIP4 []  DIP5              Sacroiliac  []  ~       []  Sacroiliac                                          Knee  []  ~       []  Knee       Ankle    []  ~       []  Ankle  []  MTP5 []  MTP4 []  MTP3 []  MTP2 []  MTP1 ~ []  MTP1 []  MTP2 []  MTP3 []  MTP4 []  MCP5  []  Toe5 []  Toe4 []  Toe3 []  Toe2 []  Toe1 ~ []  Toe1 []  Toe2 []  Toe3 []  Toe4 []  Toe5     Recent Labs and Test Results:     Lab Results   Component Value Date    CRP <0.3 09/08/2017    CRP 0.3 02/09/2017    SRWEST 4 09/08/2017    SRWEST 9 02/09/2017     Lab Results   Component Value Date    WBC 6.53 09/08/2017    HGB 11.3 (L) 09/08/2017    HCT 33.7 (L) 09/08/2017    PLT 229 09/08/2017    NEUTABS 3.74 09/08/2017    LYMPHABS 1.83 09/08/2017     Lab Results   Component Value Date    GLUCOSE 95 09/08/2017    CREAT 0.64 09/08/2017    CALCIUM 9.1 09/08/2017    TOTPRO 6.9 09/08/2017    ALBUMIN 4.2 09/08/2017    AST 14 09/08/2017    ALT 14 09/08/2017    ALKPHOS 65 09/08/2017     Lab Results   Component Value Date    CKTOT 87 04/30/2016     Lab Results   Component Value Date    C3 94 09/08/2017    C3 117 02/09/2017    C4 21 09/08/2017    C4 24 02/09/2017    DSDNAAB <=200 09/08/2017    DSDNAAB <=200 04/30/2016    NDNAABIFA <1:10 09/08/2017     No results found for: CANCA, PANCA, PROT3AB, MPOAB  No results found for: PROTCLUR, BLDUR, LEUKESTUR, RBCSUR, WBCSUR  Lab Results   Component Value Date    VITD25OH 48 08/27/2016    VITD25OH 74 (H) 04/30/2016     Outside labs reviewed  ANCA-neg  ANA IFA-neg  SSA-strong pos  ssb-neg      A&P     # sjogren's ab +, arthralgia, raynaud's, fatigue, low grade fevers and itching with intermittent hives.   -serologies-negative except for positive SSA.   - conservative management of dry eyes and dry mouth discussed  -sun avoidance  Labs today to follow up.   --[facility] at [facility] visit. Stopped plaquenil and feels that it did not help her.     --feels that body rejects meds.   OTC products for the dryness     Still would benefit from stress reduction and decreased physical stressors.     Following up with hand specialist  Given ongoing work up for hand pain and foot discomfort as well as current flare and stressors think extension off work would be appropriate.        RTC 2 months    The above recommendation were discussed with the patient.  The patient has all questions answered satisfactorily and is in agreement with this recommended plan of care.  Author:  Lutricia Horsfall 10/28/2017 10:24 AM

## 2017-11-09 ENCOUNTER — Telehealth: Payer: BLUE CROSS/BLUE SHIELD

## 2017-11-09 NOTE — Telephone Encounter
Call Back Request    MD:  Dr Elmarie Shiley    Reason for call back: pt called in requesting all medical record be sent to [facility]. Please advise thank you   [address] [place], [place] [address]  [phone]  Any Symptoms:  []  Yes  [x]  No      ? If yes, what symptoms are you experiencing:    o Duration of symptoms (how long):    o Have you taken medication for symptoms (OTC or Rx):      Patient or caller has been notified of the 24-48 hour turnaround time.

## 2017-11-09 NOTE — Telephone Encounter
We can only provide her last 2 office visit notes with Dr. Elmarie Shiley.     Entire record request will need to be submitted to [facility] medical records department.     Faxed to [phone]

## 2017-11-16 DIAGNOSIS — F439 Reaction to severe stress, unspecified: Secondary | ICD-10-CM

## 2017-11-16 DIAGNOSIS — M3501 Sicca syndrome with keratoconjunctivitis: Secondary | ICD-10-CM

## 2017-11-17 ENCOUNTER — Telehealth: Payer: BLUE CROSS/BLUE SHIELD

## 2017-11-17 NOTE — Telephone Encounter
Please advise, see note below. Thank you

## 2017-11-17 NOTE — Telephone Encounter
Call Back Request    MD:  Elmarie Shiley    Reason for call back: Patient is out in [place] stating she will be there for 10 days.  Patient forgot her medication meloxicam 7.5 mg tablet and is asking to speak with Dr. Elmarie Shiley regarding increasing dose.    Please advise, thank you.  CBN: [phone]    Any Symptoms:  []  Yes  [x]  No      ? If yes, what symptoms are you experiencing:    o Duration of symptoms (how long):    o Have you taken medication for symptoms (OTC or Rx):      Patient or caller has been notified of the 24-48 hour turnaround time.

## 2017-11-18 NOTE — Telephone Encounter
L/M requesting pharmacy info.

## 2017-11-18 NOTE — Telephone Encounter
Reply by: Lutricia Horsfall  Please find out what pharmacy to send meloxicam 15mg  to

## 2017-12-02 MED ORDER — ALBUTEROL SULFATE HFA 108 (90 BASE) MCG/ACT IN AERS
2 | Freq: Four times a day (QID) | RESPIRATORY_TRACT | 1 refills | Status: AC | PRN
Start: 2017-12-02 — End: 2018-06-01

## 2017-12-04 ENCOUNTER — Ambulatory Visit: Payer: BLUE CROSS/BLUE SHIELD

## 2017-12-04 NOTE — Telephone Encounter
See note below please advice

## 2017-12-17 ENCOUNTER — Ambulatory Visit: Payer: BLUE CROSS/BLUE SHIELD

## 2017-12-23 ENCOUNTER — Ambulatory Visit: Payer: BLUE CROSS/BLUE SHIELD

## 2017-12-23 DIAGNOSIS — R5383 Other fatigue: Secondary | ICD-10-CM

## 2017-12-28 ENCOUNTER — Ambulatory Visit: Payer: BLUE CROSS/BLUE SHIELD | Attending: Rheumatology

## 2017-12-28 DIAGNOSIS — M3500 Sicca syndrome, unspecified: Secondary | ICD-10-CM

## 2017-12-28 NOTE — Progress Notes
PATIENT: Vanessa Jensen  MRN: [id number]  DOB: May 26, 1986  DATE OF SERVICE: 12/28/2017  CHIEF COMPLAINT:   Chief Complaint   Patient presents with   ??? Sjogren's syndrome with keratoconjunctivitis sicca (HCC/RAF)   ??? Muscle Pain   ??? Brain fog   ??? Stiffness     neck   ??? Fatigue      HPI   Vanessa Jensen is a 32 y.o. female presents for the following:    12/28/17:  Bulging eye.  when gets stressed or tired. Feels very tired.   10 point ROS reviewed.    10/28/17:  Fatigued. Has been having difficulty sleeping. Grandmother died. Almost died drinking and went to detox [facility]. Was placed on a lot of psychiatric meds. Was placed on keppra, ativan, gabapentin, abilify.   Currently in AA and doing it.    Staying with friend and wife now. Was never placed on inpatient.   Had recent respiratory infection and was just given prednisone for respiratory infection.   cbd drops currently. Left foot collapsed arch and it is very painful.         09/08/17:  Joints have been aching. Had a burning redness from ankles to both knees with burning sensation.   Has been very stressed with significant life changes. Still with left wrist pain that is persistent.   Going to see specialist on Thursday. Mouth and eyes are very dry.   Integrative health coaching nutrition class online that is pretty mellow.   Recent pred taper helped a little bit.   Getting feet mris done soon.       05/06/17:  When walks on right foot feels neuropathic pain, has pain in lower back, pain in joints in hands, bilateral elbows. Feels very fatigued. Also father died last week and stress is really affecting her energy level as well. Has to push herself. At first no sleep and the last few nights was unable to get out of bed. Had recent viral infection.   10 point ROS reviewed.     03/04/17:  Saw Dr. 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 Was experiencing getting cold spells and if has chills. Gets to a point where can't breathe or talk and usually lasts about 30seconds and then resolve.   Now has been experiencing severe fatigue, dry eyes, and dry mouth. Has pain in extremities with a throb in the in distal extremities. Was taking green tea with matcha and has allergy to it.     10/08/16:   Recovered from flu and cold. Feeling a little bit better. Still with some fatigue.   10 point ROS otherwise unchanged.     08/27/16:  Has gotten the flu and a cold and has been under a lot of stress. Still with fatigue and joint pains in various areas. Still gets hives on and off. Back is uncomfortable. Lidocaine is helping with the muscle spasm.     At last visit:   Has been getting headaches since started up the prozac again. Still pretty fatigued. Coffee was making her feel bad so went to low acid coffee.     At last visit:   Got laid off while on depression leave. Feels exhausted all the time. Tried the plaquenil for about 3 weeks. The joint pains hit the knees hard sometimes. No prior injuries to knees. Biggest symptoms is the fatigue. Has been breaking out in hives. Gets blisters all over that  are itchy and pop with itching. Took benadryl and it got better. Had them for about 2 months. She is not sure if there is a trigger for this.     At last visit:   Still with fatigue. Arthralgias. Feels a fog. Intermittent red rashes in sun.  Has sicca symptoms.       Initial HPI:   Started to get recurrent yeast infections with lip swelling with bleeding, cracking hives.   For past two months. Lethargic, hives on arms, headaches, dizzy spells; nausea, night sweats.   Saw immunologist and was tested for sjogren's and told has positive ab for this. Get's raynaud's.   Feels fatigued; has insomnia (x15 years); has dry eyes; Feeling very stiff lately.  Used to drink a lot of alcohol and now cannot tolerate it. Sicca symptoms     Had recent injuries in wrist (ulnar dislocation); right wrist tendon torn; left foot collapsed arch; right foot broke toe.    Has a lot of stress in life;         PMH   Insomnia  Asthma  Pancreatitis at 14;  HPV    PSxH   Had recent injuries in wrist (ulnar dislocation); right wrist tendon torn; left foot collapsed arch; right foot broke toe.    LEEP 12/17    ALL     Allergies   Allergen Reactions   ??? Clindamycin    ??? Nitrofurantoin    ??? Tea      Green tea   ??? Fluconazole Hives   ??? Metronidazole Hives   ??? Sulfa Antibiotics Hives     MEDS     Medications that the patient states to be currently taking   Medication Sig   ??? albuterol 90 mcg/act inhaler Inhale 2 puffs every six (6) hours as needed.   ??? beclomethasone 40 mcg/act inhaler Take 2 puffs by nebulization.   ??? cetirizine 5 mg tablet    ??? meloxicam 7.5 mg tablet Take 1 tablet (7.5 mg total) by mouth daily.   ??? ondansetron 4 mg tablet Take 1 tablet (4 mg total) by mouth every six (6) hours as needed for Nausea.       [facility]     Family History   Problem Relation Age of Onset   ??? Crohn's disease Sister         half sister     Social History   Substance Use Topics   ??? Smoking status: Current Every Day Smoker   ??? Smokeless tobacco: Never Used   ??? Alcohol use Not on file       ROS   (Check if Normal, or note + findings):     [x]  reviewed questionnaire on 12/28/2017    []  Not Obtainable:   []  Constit  []  Skin    []  Eyes  []  CV   []  MS    []  ENT  []  GI  []  Heme/Lymph     []  Resp  []  GU  []  Neuro    []  Imm/All   []  Endo  []  Psych     PHYSICAL EXAM   Check if Normal, or note + findings  Vit BP 113/73  ~ Pulse (!) 48  ~ Temp 36.3 ???C (97.3 ???F)  ~ Ht 5' 3'' (1.6 m)  ~ Wt 127 lb (57.6 kg)  ~ SpO2 99%  ~ BMI 22.50 kg/m???    Gen [x]  NAD  GI []  Abd  []  Rect []  HSM  Eyes [x]  Conj/Lids []  Pupils  []  Masses []  Guard/Rebnd   ENT []  Ears/otosc    []  Oroph   []  Nasal Muc  []  Hearing GU:Fem  GN:FAOZ []  Ext   []  Vag      []  Pen  []  Scro    []  Cerv []  Uter/Ad []  Deferred  []  Prost []  Deferred   Neck []  Inspect/Palp   []  Thyroid Neuro [x]  A&O []  CN2-12 Breast []  Inspect   []  Palpation  []  DTR []  Motor/Sen   Resp [x]  Effort [x]  Ascult []  Percuss MSK [x]  Gait   [x]  ROM  []  Tone  []  Bal []  Insp/palp  []  Back   CV [x]  Auscultate []  Carotids       [x]  Edema Puls: []  Pedal []  Fem  Skin [x]  Inspect  Healed scar on lateral part of wrist. Ventral wrist and forearm scar.  []  Palp   Lymph [x]  Neck  []  Axillary  []   Femoral Psych [x]  Insight/judg  []  Affect []  Cog      28 joints examined   ~~~~~~~~~~~~~~~~~~~~~~~~~~~~~~~~~~~~~~~~~~~~~~~~~~~~~~~~  RIGHT  Tender Joint Count:   LEFT   TMJ []   ~ []  TMJ   Shoulder []  ~ []  Shoulder   Elbow []  ~ []  Elbow   Wrist []  ~ [x]  Wrist   CMC1 []  ~ []  CMC1  ~  []  MCP5 []  MCP4 []  MCP3 []  MCP2 []  MCP1 ~ []  MCP1 []  MCP2 []  MCP3 []  MCP4 []  MCP5  []  PIP5 []  PIP4 []  PIP3 []  PIP2 []  IP1 ~ []  IP1 []  PIP2 []  PIP3 []  PIP4 []  PIP5   []  DIP5 []  DIP4 []  DIP3 []  DIP2~  ~  []  DIP2 []  DIP3 []  DIP4 []  DIP5                 []  cervical spine                 []  thoracic spine                 []  lumbar spine      Sacroiliac  []  ~       []  Sacroiliac                Hip  []  ~       []  Hip                  Knee  []  ~       []  Knee        Ankle    []  ~       []  Ankle      []  MTP5 []  MTP4 []  MTP3 []  MTP2 []  MTP1 ~ []  MTP1 []  MTP2 []  MTP3 []  MTP4 []  MCP5  []  Toe5 []  Toe4 []  Toe3 []  Toe2 []  Toe1 ~ []  Toe1 []  Toe2 []  Toe3 []  Toe4 []  Toe5     ~~~~~~~~~~~~~~~~~~~~~~~~~~~~~~~~~~~~~~~~~~~~~~~~~~~~~~~~   RIGHT  Swollen Joint Count:   LEFT   TMJ []   ~ []  TMJ   Shoulder []  ~ []  Shoulder   Elbow []  ~ []  Elbow   Wrist []  ~ [x]  Wrist   CMC1 []  ~ []  CMC1  ~  []  MCP5 []  MCP4 []  MCP3 []  MCP2 []  MCP1 ~ []  MCP1 []  MCP2 []  MCP3 []  MCP4 []  MCP5  []  PIP5 []  PIP4 []  PIP3 []  PIP2 []  IP1 ~ []  IP1 []  PIP2 []  PIP3 []  PIP4 []  PIP5   []  DIP5 []  DIP4 []   DIP3 []  DIP2~  ~  []  DIP2 []  DIP3 []  DIP4 []  DIP5              Sacroiliac  []  ~       []  Sacroiliac                                          Knee  []  ~       []  Knee       Ankle    []  ~       []  Ankle []  MTP5 []  MTP4 []  MTP3 []  MTP2 []  MTP1 ~ []  MTP1 []  MTP2 []  MTP3 []  MTP4 []  MCP5  []  Toe5 []  Toe4 []  Toe3 []  Toe2 []  Toe1 ~ []  Toe1 []  Toe2 []  Toe3 []  Toe4 []  Toe5     Recent Labs and Test Results:     Lab Results   Component Value Date    CRP <0.3 09/08/2017    CRP 0.3 02/09/2017    SRWEST 4 09/08/2017    SRWEST 9 02/09/2017     Lab Results   Component Value Date    WBC 6.53 09/08/2017    HGB 11.3 (L) 09/08/2017    HCT 33.7 (L) 09/08/2017    PLT 229 09/08/2017    NEUTABS 3.74 09/08/2017    LYMPHABS 1.83 09/08/2017     Lab Results   Component Value Date    GLUCOSE 95 09/08/2017    CREAT 0.64 09/08/2017    CALCIUM 9.1 09/08/2017    TOTPRO 6.9 09/08/2017    ALBUMIN 4.2 09/08/2017    AST 14 09/08/2017    ALT 14 09/08/2017    ALKPHOS 65 09/08/2017     Lab Results   Component Value Date    CKTOT 87 04/30/2016     Lab Results   Component Value Date    C3 94 09/08/2017    C3 117 02/09/2017    C4 21 09/08/2017    C4 24 02/09/2017    DSDNAAB <=200 09/08/2017    DSDNAAB <=200 04/30/2016    NDNAABIFA <1:10 09/08/2017     No results found for: CANCA, PANCA, PROT3AB, MPOAB  No results found for: PROTCLUR, BLDUR, LEUKESTUR, RBCSUR, WBCSUR  Lab Results   Component Value Date    VITD25OH 48 08/27/2016    VITD25OH 74 (H) 04/30/2016     Outside labs reviewed  ANCA-neg  ANA IFA-neg  SSA-strong pos  ssb-neg      A&P     # sjogren's ab +, arthralgia, raynaud's, fatigue, low grade fevers and itching with intermittent hives.   -serologies-negative except for positive SSA.   - conservative management of dry eyes and dry mouth discussed  -sun avoidance  Labs today to follow up.   --referral to [facility] at last visit.   Stopped plaquenil and feels that it did not help her.     --feels that body rejects meds.   OTC products for the dryness     Still would benefit from stress reduction and decreased physical stressors.     Following up with hand specialist    Given ongoing work up for hand pain and foot discomfort as well as current flare and stressors think extension off work would be appropriate.        RTC 2 months    The above recommendation were discussed with the patient.  The  patient has all questions answered satisfactorily and is in agreement with this recommended plan of care.  Author:  Lutricia Horsfall 12/28/2017 12:06 PM

## 2017-12-29 LAB — Vitamin D,25-Hydroxy: VITAMIN D,25-HYDROXY: 40 ng/mL (ref 20–50)

## 2017-12-29 LAB — Comprehensive Metabolic Panel
SODIUM: 141 mmol/L (ref 135–146)
TOTAL CO2: 27 mmol/L (ref 20–30)

## 2017-12-29 LAB — Differential Automated: ABSOLUTE BASO COUNT: 0.08 10*3/uL (ref 0.00–0.10)

## 2017-12-29 LAB — Vitamin B12: VITAMIN B12: 630 pg/mL (ref 254–1060)

## 2017-12-29 LAB — PROTEIN/CREATININE RATIO, URINE: CREATININE,RANDOM URINE: 80.7 mg/dL (ref 0.0–0.4)

## 2017-12-29 LAB — T3,Total: T3,TOTAL: 138 ng/dL (ref 85–185)

## 2017-12-29 LAB — TSH with reflex FT4, FT3: TSH: 1.9 u[IU]/mL (ref 0.3–4.7)

## 2017-12-29 LAB — CBC: PLATELET COUNT, AUTO: 276 10*3/uL (ref 143–398)

## 2017-12-29 LAB — Ferritin: FERRITIN: 35 ng/mL (ref 8–180)

## 2017-12-29 LAB — Free T4: FREE T4: 1.1 ng/dL (ref 0.8–1.7)

## 2017-12-29 LAB — Iron & Iron Binding Capacity: % SATURATION: 15 ug/dL (ref 262–502)

## 2017-12-29 LAB — C-Reactive Protein: C-REACTIVE PROTEIN: 0.6 mg/dL (ref <0.8)

## 2017-12-29 LAB — Folate,Serum: FOLATE,SERUM: 13.1 ng/mL (ref 8.1–30.4)

## 2017-12-29 LAB — dsDNA Ab IFA: NDNA (CRITHIDIA) AB IFA: <1:10 {titer}

## 2017-12-29 LAB — Thyroglobulin Antibody: THYROGLOBULIN ANTIBODY: <0.9 [IU]/mL (ref <4.0)

## 2017-12-29 LAB — Sedimentation Rate, Erythrocyte: SEDIMENTATION RATE, ERYTHROCYTE: 15 mm/h (ref <=25)

## 2017-12-29 LAB — CK, Total: CREATINE PHOSPHOKINASE, TOTAL: 122 U/L (ref 38–282)

## 2017-12-29 LAB — Thyroid Peroxidase Antibody: THYROID PEROXIDASE ANTIBODY: 16.2 [IU]/mL (ref <=20)

## 2017-12-29 LAB — C4: C4: 23 mg/dL (ref 14–46)

## 2017-12-29 LAB — C3: C3: 108 mg/dL (ref 76–165)

## 2017-12-30 ENCOUNTER — Ambulatory Visit: Payer: BLUE CROSS/BLUE SHIELD | Attending: Rheumatology

## 2017-12-30 LAB — dsDNA Ab EIA: DSDNA AB EIA: <=200 [IU]/mL (ref <=200)

## 2018-01-05 ENCOUNTER — Inpatient Hospital Stay: Payer: BLUE CROSS/BLUE SHIELD

## 2018-01-05 DIAGNOSIS — R52 Pain, unspecified: Secondary | ICD-10-CM

## 2018-01-13 LAB — Thyroid Stimulating Immunoglobulin: THYROID STIMULATING IMMUNOGLOBULIN: 104 (ref <=122)

## 2018-03-16 ENCOUNTER — Ambulatory Visit

## 2018-03-16 DIAGNOSIS — S29012A Strain of muscle and tendon of back wall of thorax, initial encounter: Secondary | ICD-10-CM

## 2018-03-16 DIAGNOSIS — M549 Dorsalgia, unspecified: Secondary | ICD-10-CM

## 2018-03-16 DIAGNOSIS — M7918 Myalgia, other site: Secondary | ICD-10-CM

## 2018-03-16 NOTE — Progress Notes
[facility] Progress Note   Patient: Vanessa Jensen MRN: [id number] Date of Birth: [birth year] Date of Service: 03/16/2018   Referring Provider: Corliss Marcus, MD, MS Primary Care Provider: Aviva Kluver, MD  Chief Complaint   Back Pain    History of Present Illness   Vanessa Jensen is a 32 y.o. female who presents with Back Pain    Interval history  03/16/18  Pain Location: Neck, Shoulder, Upper, Mid, Lower, Back  Pain: Stable  Sleep: Stable  Acupuncture Comments: Last Tx: 08/2017. CC: upper back/shoulder pain, neck pain, LBP. Upper back/shoulder pain most bothersome, R>L, feels like ''lots of knots'' and discomfort, aggravated by sleeping on air mattress at brother's place, recently got memory foam pad, going to PT, no UE Sxs. Neck pain stable, patient states imaging shows cervical disc protrusions. She gets frequent R temporal HAs, no HA today. LBP still bothersome, received cortisone injection ~6 weeks ago. Self care includes stretching, ball massage.     Upper back pain, pain between shoulder blades, worse on right side  Saw 2 specialists, one advised muscular pain, other recommended thoracic joint injection scheduled for Friday, hesitant about procedure  Since last visit, grandmother passed away, moved out of negative living situation, sleeping at brother's place    08/20/17  Neck shoulder pain  Context of significant stress  Father passed aware in Sept, had to find way to pay for father's cremation  Broke left wrist in October, had to undergo surgery [facility]  Tension built up in neck and shoulder    03/27/17  Right mid back pain, better with cupping, 5/10 pain today  Left shoulder pain  Left low back pain, 4-5/10, stiffness  Leg swelling worse at end of day  THC for sleep, but doesn't achieve deep sleep  Got posture corrector, also chrysanthemum tea    Initial intake 05/29/16  Fatigue, energy level low  Even with good sleep, 8h/night, less than 5/10  Context of insomnia for years, stopped trazadone Uses THC, smoked, which helps  ???  Headaches since high school, saw neurologist at the time, but could not figure out the cause, diagnosed with chronic mild migraines, brain imaging negative  Since a month ago, headaches more severe, temporal, currently 4/10. Can be severe up to 8-9/10  Sharp pain, not so much throbbing  Occurring in context of neck pain  ???  Neck pain, assoc stiffness, worse over past month  Had TPI through immunologist, which helped, best relief for a couple days  Headaches improved a bit after neck pain improved  ???  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 Asthma    ??? CIN III (cervical intraepithelial neoplasia III)     s/p LEEP 12/16   ??? Depression with anxiety     prior suicide attempt   ??? History of alcohol abuse     h/o alchohol withdrawls   ??? History of eating disorder    ???  History of pancreatitis 2001   ??? History of substance abuse    ??? Insomnia    ??? Migraines    ??? Sjogren's syndrome (HCC/RAF)    ??? Urticaria      Patient Active Problem List    Diagnosis Date Noted   ??? Sjogren's syndrome (HCC/RAF) 10/08/2016     Outpatient Medications Prior to Visit   Medication Sig   ??? albuterol 90 mcg/act inhaler Inhale 2 puffs every six (6) hours as needed.   ??? beclomethasone 40 mcg/act inhaler Take 2 puffs by nebulization.   ??? cetirizine 5 mg tablet    ??? doxycycline 100 mg tablet TAKE 1 TABLET BY MOUTH TWICE A DAY FOR 10 DAYS ??? etonogestrel-ethinyl estradiol (NUVARING) 0.12-0.015 mg/24 hr vaginal ring One ring x 3 weeks.  Remove for one week, then insert new ring and repeat cycle.   ??? fluoxetine 20 mg capsule TAKE ONE CAOSULE BY MOUTH EVERY MORNING   ??? hydroxyzine 25 mg tablet    ??? ibuprofen 200 mg tablet Take 200 mg by mouth every six (6) hours as needed.   ??? ibuprofen 600 mg tablet    ??? lorazepam 0.5 mg tablet TAKE 1 TABLET BY MOUTH EVERY 6 HOURS AS NEEDED ANXIETY   ??? meloxicam 7.5 mg tablet Take 1 tablet (7.5 mg total) by mouth daily.   ??? mirtazapine 15 mg tablet Take 15 mg by mouth.   ??? mirtazapine 15 mg tablet TAKE 1 TABLET BY MOUTH EVERY DAY AT NIGHT   ??? ondansetron 4 mg tablet Take 1 tablet (4 mg total) by mouth every six (6) hours as needed for Nausea.   ??? PATADAY 0.2 % ophthalmic solution    ??? predniSONE 20 mg tablet    ??? trazodone 50 mg tablet TAKE 1 TO 3 TABLETS BY MOUTH AT BEDTIME AS NEEDED FOR INSOMNIA.   ??? hydroxychloroquine (PLAQUENIL) 200 mg tablet Take 1 tablet (200 mg total) by mouth daily. (Patient not taking: Reported on 12/28/2017.)   ??? HYDROXYCHLOROQUINE 200 mg tablet TAKE 1 TABLET BY MOUTH EVERY DAY (Patient not taking: Reported on 12/28/2017)   ??? loratadine-pseudoephedrine 10-240 mg tablet Take 1 tablet by mouth daily. (Patient not taking: Reported on 12/28/2017.)     No facility-administered medications prior to visit.      Allergies     Allergies   Allergen Reactions   ??? Clindamycin    ??? Nitrofurantoin    ??? Tea      Green tea   ??? Fluconazole Hives   ??? Metronidazole Hives   ??? Sulfa Antibiotics Hives     Review of Systems   Const:    GI:    Musculo:  Upper back pain  Skin:    Neuro:    Psych:  stress  Physical Examination   BP 113/65  ~ Pulse 73  ~ Temp 36.9 ???C (98.5 ???F) (Temporal)  ~ Ht 5' 3'' (1.6 m)  ~ Wt 127 lb (57.6 kg)  ~ BMI 22.50 kg/m???   General: Well-appearing, no distress.  Eyes: Eyelids normal. Conjunctiva normal.   ENMT: External ears and nose normal.   Neck: Neck symmetric. Cardiovasc: No lower extremity edema.   Musculo: Tenderness to palpation as noted on annotated image.   Skin: Rashes none.  Psych: Mood and affect normal.  Trigger/Tender Point Identification: (see annotated image)        Laboratory studies / Diagnostics / Imaging   Obtained and reviewed old records from [facility].    Assessment /  Plan   Vanessa Jensen is a 32 y.o. female with  1. Strain of rhomboid muscle, initial encounter  Inject TendonSheath/Ligament BP   2. Upper back pain  Acupuncture   3. Myofascial pain          Plan  #Upper back pain  -Acupuncture today  -Ok for gentle self massage  -Add MFR cupping next visit    #Rhomboid strain, initial, significant trigger points  -TPI today    Reviewed treatments, and treatment benefits, risks, alternatives with patient. acupuncture and TPI as discussed.     Acupuncture Points:  (KD3, UB59, UB10, GB21 x2, SI13, SI12, SI11, SI10, Rhomboid Ashi, UB15, UB23, L5 HTJJ, QL Ashi; (R) Rhomboid Ashi, Temporalis Ashi; (L) UB62)   Acupuncture Treatment Duration: 20 Minutes  Injection of : Tendon Sheath with 0.78mL 1% lidocaine, Patient Tolerated Procedure Well  Injection with:: 0.49mL of 1% lidocaine  Injection with:: 0.2 mL  Trapezius Supraspinatus, Rhomboids                Return in about 3 days (around 03/19/2018).     Patient seen and examined with Barton Dubois, LAc. I was present and available on site for the procedures performed and agree with the history, findings, assessment, and plan of care we formulated together as documented above.     Corliss Marcus, MD, MS  03/16/2018 3:32 PM    There are no Patient Instructions on file for this visit.

## 2018-03-19 ENCOUNTER — Ambulatory Visit

## 2018-03-29 ENCOUNTER — Telehealth

## 2018-03-29 NOTE — Telephone Encounter
Please see chart message requesting letter for time off work from 02/06/17 to 11/2017.

## 2018-03-29 NOTE — Telephone Encounter
Good afternoon,     Pt stated she needs a note from Dr. Elmarie ShileyKaldas stating she was out on disability from 02/06/17- April of 2019. Pt does not remember exacts dates but stated it should be on her chart. Pt would like note uploaded to her mychart.     Thank you

## 2018-04-01 ENCOUNTER — Ambulatory Visit

## 2018-04-01 NOTE — Telephone Encounter
Reply by: Lutricia HorsfallMarian Gillermo Poch    Letter written.

## 2018-04-01 NOTE — Telephone Encounter
Okay 

## 2018-04-14 ENCOUNTER — Ambulatory Visit

## 2018-04-14 DIAGNOSIS — M549 Dorsalgia, unspecified: Secondary | ICD-10-CM

## 2018-04-14 DIAGNOSIS — S29012S Strain of muscle and tendon of back wall of thorax, sequela: Secondary | ICD-10-CM

## 2018-04-14 NOTE — Progress Notes
[facility] Progress Note   Patient: Vanessa Jensen MRN: [id number] Date of Birth: 1985/12/25 Date of Service: 04/14/2018   Referring Provider: No ref. provider found Primary Care Provider: Pcp, No, MD  Chief Complaint   Back Pain    History of Present Illness   Vanessa Jensen is a 31 y.o. female who presents with Back Pain    Interval history  04/14/18      Was in [place] visiting friends and family for 2 weeks, feels better after trip, has better perspective  Mid back pain, worse on right   Also pain in arms and hands    03/16/18  Upper back pain, pain between shoulder blades, worse on right side  Saw 2 specialists, one advised muscular pain, other recommended thoracic joint injection scheduled for Friday, hesitant about procedure  Since last visit, grandmother passed away, moved out of negative living situation, sleeping at brother's place    08/20/17  Neck shoulder pain  Context of significant stress  Father passed aware in Sept, had to find way to pay for father's cremation  Broke left wrist in October, had to undergo surgery at [facility]  Tension built up in neck and shoulder    03/27/17  Right mid back pain, better with cupping, 5/10 pain today  Left shoulder pain  Left low back pain, 4-5/10, stiffness  Leg swelling worse at end of day  THC for sleep, but doesn't achieve deep sleep  Got posture corrector, also chrysanthemum tea    Initial intake 05/29/16  Fatigue, energy level low  Even with good sleep, 8h/night, less than 5/10  Context of insomnia for years, stopped trazadone  Uses THC, smoked, which helps  ???  Headaches since high school, saw neurologist at the time, but could not figure out the cause, diagnosed with chronic mild migraines, brain imaging negative  Since a month ago, headaches more severe, temporal, currently 4/10. Can be severe up to 8-9/10  Sharp pain, not so much throbbing  Occurring in context of neck pain  ???  Neck pain, assoc stiffness, worse over past month Had TPI through immunologist, which helped, best relief for a couple days  Headaches improved a bit after neck pain improved  ???  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 Asthma    ??? CIN III (cervical intraepithelial neoplasia III)     s/p LEEP 12/16   ??? Depression with anxiety     prior suicide attempt   ??? History of alcohol abuse     h/o alchohol withdrawls   ??? History of eating disorder    ??? History of pancreatitis 2001   ??? History of substance abuse    ??? Insomnia    ??? Migraines    ??? Sjogren's syndrome (HCC/RAF)    ??? Urticaria      Patient Active Problem List    Diagnosis Date Noted   ??? Sjogren's syndrome (HCC/RAF) 10/08/2016  Outpatient Medications Prior to Visit   Medication Sig   ??? albuterol 90 mcg/act inhaler Inhale 2 puffs every six (6) hours as needed.   ??? beclomethasone 40 mcg/act inhaler Take 2 puffs by nebulization.   ??? cetirizine 5 mg tablet    ??? doxycycline 100 mg tablet TAKE 1 TABLET BY MOUTH TWICE A DAY FOR 10 DAYS   ??? etonogestrel-ethinyl estradiol (NUVARING) 0.12-0.015 mg/24 hr vaginal ring One ring x 3 weeks.  Remove for one week, then insert new ring and repeat cycle.   ??? fluoxetine 20 mg capsule TAKE ONE CAOSULE BY MOUTH EVERY MORNING   ??? hydroxychloroquine (PLAQUENIL) 200 mg tablet Take 1 tablet (200 mg total) by mouth daily.   ??? HYDROXYCHLOROQUINE 200 mg tablet TAKE 1 TABLET BY MOUTH EVERY DAY   ??? hydroxyzine 25 mg tablet ??? ibuprofen 200 mg tablet Take 200 mg by mouth every six (6) hours as needed.   ??? ibuprofen 600 mg tablet    ??? loratadine-pseudoephedrine 10-240 mg tablet Take 1 tablet by mouth daily.   ??? lorazepam 0.5 mg tablet TAKE 1 TABLET BY MOUTH EVERY 6 HOURS AS NEEDED ANXIETY   ??? meloxicam 7.5 mg tablet Take 1 tablet (7.5 mg total) by mouth daily.   ??? mirtazapine 15 mg tablet Take 15 mg by mouth.   ??? mirtazapine 15 mg tablet TAKE 1 TABLET BY MOUTH EVERY DAY AT NIGHT   ??? ondansetron 4 mg tablet Take 1 tablet (4 mg total) by mouth every six (6) hours as needed for Nausea.   ??? PATADAY 0.2 % ophthalmic solution    ??? predniSONE 20 mg tablet    ??? trazodone 50 mg tablet TAKE 1 TO 3 TABLETS BY MOUTH AT BEDTIME AS NEEDED FOR INSOMNIA.     No facility-administered medications prior to visit.      Allergies     Allergies   Allergen Reactions   ??? Clindamycin    ??? Nitrofurantoin    ??? Tea      Green tea   ??? Fluconazole Hives   ??? Metronidazole Hives   ??? Sulfa Antibiotics Hives     Review of Systems   Const:    GI:    Musculo:  Mid pain  Skin:    Neuro:    Psych:  stress  Physical Examination   There were no vitals taken for this visit.  General: Well-appearing, no distress.  Eyes: Eyelids normal. Conjunctiva normal.   ENMT: External ears and nose normal.   Neck: Neck symmetric.    Cardiovasc: No lower extremity edema.   Musculo: Tenderness to palpation as noted on annotated image.   Skin: Rashes none.  Psych: Mood and affect normal.  Trigger/Tender Point Identification: (see annotated image)        Laboratory studies / Diagnostics / Imaging   Obtained and reviewed old records from [facility].    Assessment / Plan   Vanessa Jensen is a 32 y.o. female with  1. Mid back pain  Acupuncture   2. Strain of rhomboid muscle, sequela  Inject TendonSheath/Ligament BP        Plan  #Mid pain  -Acupuncture today  -Ok for gentle self massage  -Defer MFR cupping per cosmetic reasons today, recommend MFR cupping next week  -Self massage recommended #Rhomboid strain, sequela  -TPI today    Reviewed treatments, and treatment benefits, risks, alternatives with patient. acupuncture and TPI as discussed.     Acupuncture Points:  (ub12-19, L si5 li10, R  li10 sj5 si3)   Acupuncture Treatment Duration: 20 Minutes  Injection of : Tendon Sheath with 0.42mL 1% lidocaine, Patient Tolerated Procedure Well  Injection with:: 0.81mL of 1% lidocaine  Injection with:: 0.2 mL    Rhomboids Latissimus dorsi              Return in about 1 week (around 04/21/2018).       Corliss Marcus, MD, MS  04/14/2018 10:02 AM    Patient Instructions   Self massage to back with tennis balls preferably in stockings (alternatively, can use foam roller) at location of pain or tightness in back at least 2 times a day, 1-2 minutes at a time. Can perform more as desired.        Pictures are for low back, but same approach can be used for mid and upper back.

## 2018-04-14 NOTE — Patient Instructions
Self massage to back with tennis balls preferably in stockings (alternatively, can use foam roller) at location of pain or tightness in back at least 2 times a day, 1-2 minutes at a time. Can perform more as desired.        Pictures are for low back, but same approach can be used for mid and upper back.

## 2018-04-22 ENCOUNTER — Ambulatory Visit

## 2018-04-22 DIAGNOSIS — S29012D Strain of muscle and tendon of back wall of thorax, subsequent encounter: Secondary | ICD-10-CM

## 2018-04-22 DIAGNOSIS — M549 Dorsalgia, unspecified: Secondary | ICD-10-CM

## 2018-04-22 DIAGNOSIS — J00 Acute nasopharyngitis [common cold]: Secondary | ICD-10-CM

## 2018-04-22 MED ORDER — BENZONATATE 100 MG PO CAPS
200 mg | ORAL_CAPSULE | Freq: Three times a day (TID) | ORAL | 0 refills | Status: AC | PRN
Start: 2018-04-22 — End: 2018-05-04

## 2018-04-22 NOTE — Progress Notes
[facility] Progress Note   Patient: Vanessa Jensen MRN: [id number] Date of Birth: 1985/11/05 Date of Service: 04/22/2018   Referring Provider: No ref. provider found Primary Care Provider: Pcp, No, MD  Chief Complaint   Back Pain    History of Present Illness   Vanessa Jensen is a 32 y.o. female who presents with Back Pain    Interval history  04/22/18      Right mid back pain  Sore throat a week ago, nasal congestion and sneezing, cough with variable white and yellow sputum  Sore throat better, but some residual throat discomfort  No fevers or chills  Was on plane and friends also came down with similar symptoms    04/14/18  Was in [place] visiting friends and family for 2 weeks, feels better after trip, has better perspective  Mid back pain, worse on right   Also pain in arms and hands    03/16/18  Upper back pain, pain between shoulder blades, worse on right side  Saw 2 specialists, one advised muscular pain, other recommended thoracic joint injection scheduled for Friday, hesitant about procedure  Since last visit, grandmother passed away, moved out of negative living situation, sleeping at brother's place    08/20/17  Neck shoulder pain  Context of significant stress  Father passed aware in Sept, had to find way to pay for father's cremation  Broke left wrist in October, had to undergo surgery at [facility]  Tension built up in neck and shoulder    03/27/17  Right mid back pain, better with cupping, 5/10 pain today  Left shoulder pain  Left low back pain, 4-5/10, stiffness  Leg swelling worse at end of day  THC for sleep, but doesn't achieve deep sleep  Got posture corrector, also chrysanthemum tea    Initial intake 05/29/16  Fatigue, energy level low  Even with good sleep, 8h/night, less than 5/10  Context of insomnia for years, stopped trazadone  Uses THC, smoked, which helps  ???  Headaches since high school, saw neurologist at the time, but could not figure out the cause, diagnosed with chronic mild migraines, brain imaging negative  Since a month ago, headaches more severe, temporal, currently 4/10. Can be severe up to 8-9/10  Sharp pain, not so much throbbing  Occurring in context of neck pain  ???  Neck pain, assoc stiffness, worse over past month  Had TPI through immunologist, which helped, best relief for a couple days  Headaches improved a bit after neck pain improved  ???  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 Asthma    ??? CIN III (cervical intraepithelial neoplasia III)     s/p LEEP 12/16   ??? Depression with anxiety     prior suicide attempt   ??? History of alcohol abuse     h/o alchohol withdrawls   ??? History of eating disorder    ??? History of  pancreatitis 2001   ??? History of substance abuse    ??? Insomnia    ??? Migraines    ??? Sjogren's syndrome (HCC/RAF)    ??? Urticaria      Patient Active Problem List    Diagnosis Date Noted   ??? Sjogren's syndrome (HCC/RAF) 10/08/2016     Outpatient Medications Prior to Visit   Medication Sig   ??? albuterol 90 mcg/act inhaler Inhale 2 puffs every six (6) hours as needed.   ??? beclomethasone 40 mcg/act inhaler Take 2 puffs by nebulization.   ??? cetirizine 5 mg tablet    ??? doxycycline 100 mg tablet TAKE 1 TABLET BY MOUTH TWICE A DAY FOR 10 DAYS   ??? etonogestrel-ethinyl estradiol (NUVARING) 0.12-0.015 mg/24 hr vaginal ring One ring x 3 weeks.  Remove for one week, then insert new ring and repeat cycle.   ??? fluoxetine 20 mg capsule TAKE ONE CAOSULE BY MOUTH EVERY MORNING   ??? hydroxychloroquine (PLAQUENIL) 200 mg tablet Take 1 tablet (200 mg total) by mouth daily.   ??? HYDROXYCHLOROQUINE 200 mg tablet TAKE 1 TABLET BY MOUTH EVERY DAY   ??? hydroxyzine 25 mg tablet    ??? ibuprofen 200 mg tablet Take 200 mg by mouth every six (6) hours as needed.   ??? ibuprofen 600 mg tablet    ??? loratadine-pseudoephedrine 10-240 mg tablet Take 1 tablet by mouth daily.   ??? lorazepam 0.5 mg tablet TAKE 1 TABLET BY MOUTH EVERY 6 HOURS AS NEEDED ANXIETY   ??? meloxicam 7.5 mg tablet Take 1 tablet (7.5 mg total) by mouth daily.   ??? mirtazapine 15 mg tablet Take 15 mg by mouth.   ??? mirtazapine 15 mg tablet TAKE 1 TABLET BY MOUTH EVERY DAY AT NIGHT   ??? ondansetron 4 mg tablet Take 1 tablet (4 mg total) by mouth every six (6) hours as needed for Nausea.   ??? PATADAY 0.2 % ophthalmic solution    ??? predniSONE 20 mg tablet    ??? trazodone 50 mg tablet TAKE 1 TO 3 TABLETS BY MOUTH AT BEDTIME AS NEEDED FOR INSOMNIA.     No facility-administered medications prior to visit.      Allergies     Allergies   Allergen Reactions   ??? Clindamycin    ??? Nitrofurantoin    ??? Tea      Green tea   ??? Fluconazole Hives   ??? Metronidazole Hives   ??? Sulfa Antibiotics Hives     Review of Systems   Const:  ENT: nasal congestion  Resp: cough    GI:    Musculo:  Mid pain  Skin:    Neuro:    Psych:  stress  Physical Examination   BP 116/78  ~ Pulse 73  ~ Temp 37 ???C (98.6 ???F) (Temporal)  ~ Ht 5' 3'' (1.6 m)  ~ Wt 127 lb (57.6 kg)  ~ BMI 22.50 kg/m???   General: Well-appearing, no distress.  Eyes: Eyelids normal. Conjunctiva normal.   ENMT: External ears and nose normal. Edematous and erythematous nasal turbinates, TM clear  Neck: Neck symmetric.    Cardiovasc: No lower extremity edema.   Resp: CTAB, no w/r/r  Musculo: Tenderness to palpation as noted on annotated image.   Skin: Rashes none.  Psych: Mood and affect normal. Trigger/Tender Point Identification: (see annotated image)      Laboratory studies / Diagnostics / Imaging   Obtained and reviewed old records from [facility].    Assessment / Plan  Dilcia Rybarczyk is a 32 y.o. female with  1. Acute nasopharyngitis     2. Rhomboid muscle strain, subsequent encounter  Inject TendonSheath/Ligament BP   3. Mid back pain  Acupuncture    Myofascial Release Massage        Plan  #URI  -Pseudoephedrine for congestion  -Benzonatate for cough  -Supplement cinnamon twig combination for symptoms    #Mid pain  -MFR cupping today  -Self massage recommended    #Rhomboid strain, subsequent  -TPI today    Reviewed treatments, and treatment benefits, risks, alternatives with patient. acupuncture and TPI as discussed.     Acupuncture Points:  (gb20 du23 li20 li4 sj5 st36)   Acupuncture Treatment Duration: 20 Minutes  Myofascial Release Massage Duration:  (15 minutes, cupping - mid back pain)  Injection of : Tendon Sheath with 0.67mL 1% lidocaine, Patient Tolerated Procedure Well  Injection with:: 0.21mL of 1% lidocaine  Injection with:: 0.2 mL    Rhomboids (right)                Return in about 1 week (around 04/29/2018).       Corliss Marcus, MD, MS  04/22/2018 12:02 PM    Patient Instructions   Recommend the following:    Cinnamon Twig decoction (Gui Zhi Tang)    Instructions vary by manufacturer, but will likely need to be taken 2-3 times a day.    Ingredients: Cinnamon twig, White peony root, Ginger, Chinese dates, Licorice root    Take with herbal supplement:  Pseudoephedrine (Sudafed) 2-3 times a day    Take for cough:  Benzonatate 200mg  2-3 times a day  Take consistently for first 3-4 days, then as needed thereafter if the cough has improved.    For the herbal supplement, please find at this herb store:    [facility]  [facility]  [address] [place], [place] [address]  Ph: [phone]  Hours: M-Su 10am-6:30pm

## 2018-04-22 NOTE — Patient Instructions
Recommend the following:    Cinnamon Twig decoction (Gui Zhi Tang)    Instructions vary by manufacturer, but will likely need to be taken 2-3 times a day.    Ingredients: Cinnamon twig, White peony root, Ginger, Chinese dates, Licorice root    Take with herbal supplement:  Pseudoephedrine (Sudafed) 2-3 times a day    Take for cough:  Benzonatate 200mg  2-3 times a day  Take consistently for first 3-4 days, then as needed thereafter if the cough has improved.    For the herbal supplement, please find at this herb store:    [facility]  [facility]  [address] [place], [place] [address]  Ph: [phone]  Hours: M-Su 10am-6:30pm

## 2018-04-23 ENCOUNTER — Ambulatory Visit

## 2018-04-23 DIAGNOSIS — Z113 Encounter for screening for infections with a predominantly sexual mode of transmission: Secondary | ICD-10-CM

## 2018-04-23 DIAGNOSIS — Z01419 Encounter for gynecological examination (general) (routine) without abnormal findings: Secondary | ICD-10-CM

## 2018-04-24 LAB — HCV Ab Screen: HCV ANTIBODY SCREEN: NONREACTIVE

## 2018-04-24 LAB — HIV-1/2 Ag/Ab 4th Generation with Reflex Confirmation: HIV-1/2 AG/AB 4TH GENERATION WITH REFLEX CONFIRMATION: NONREACTIVE

## 2018-04-24 LAB — Bacterial Vaginosis Screen

## 2018-04-24 LAB — HBs Ag: HEPATITIS B SURFACE ANTIGEN: NONREACTIVE

## 2018-04-24 NOTE — Progress Notes
[facility]   OBSTETRICS, GYNECOLOGY & UROGYNECOLOGY     PATIENT: Vanessa Jensen  MRN: [id number]  DOB: 1986-08-10  DATE OF SERVICE: 04/26/2018  PCP: Aviva Kluver, MD     Chief Complaint:   Chief Complaint   Patient presents with   ??? Gynecologic Exam       Subjective:  Shakiya Mcneary is a 32 y.o. female G1P0010 here for vaginal discharge, also due for annual well woman exam.    Pt states she has hx of recurrent UTIs and yeast infections, but has them less often now.   Had used boric acid in the past, but was told the acidicity can increase risk of HPV growth.   Reports some vaginal discharge with vaginal irritation for few days.     Last Pap: 03/2017-normal pap, hpv was not done.     Menses: q month, lasting 5 days. Used plan B last month, and had irregular period.   Sexually active: yes  Hx of STI: denies  Hx of abnormal pap smears: yes, see below  Hx of abnormal mammograms: n/a    Past Ob/Gyn Hx  OB History     Gravida Para Term Preterm AB Living    1       1      SAB TAB Ectopic Multiple Live Births    1                Obstetric Comments    Hx of LEEP in 2006 and 2015. Last pap 03/2017.           See above for gyn history    Past medical/surgical/family history updated.    Past Medical Hx  Past Medical History:   Diagnosis Date   ??? Asthma    ??? CIN III (cervical intraepithelial neoplasia III)     s/p LEEP 12/16   ??? Depression with anxiety     prior suicide attempt   ??? History of alcohol abuse     h/o alchohol withdrawls   ??? History of eating disorder    ??? History of pancreatitis 2001   ??? History of substance abuse    ??? Insomnia    ??? Migraines    ??? Sjogren's syndrome (HCC/RAF)    ??? Urticaria        Past Surgical Hx  Past Surgical History:   Procedure Laterality Date   ??? CERVICAL BIOPSY  W/ LOOP ELECTRODE EXCISION  2006, 07/19/2015   ??? DILATION AND CURETTAGE OF UTERUS     ??? TOE SURGERY  08/2014   ??? WRIST SURGERY  03/2015    dislocated ulna, torn tendon       Past Family Hx  Family History Problem Relation Age of Onset   ??? Crohn's disease Sister         half sister       Social Hx:  Social History     Social History   ??? Marital status: Single     Spouse name: N/A   ??? Number of children: N/A   ??? Years of education: N/A     Social History Main Topics   ??? Smoking status: Current Every Day Smoker   ??? Smokeless tobacco: Never Used   ??? Alcohol use None   ??? Drug use: Yes     Types: Marijuana   ??? Sexual activity: Yes     Partners: Male     Other Topics Concern   ??? None     Social History  Narrative   ??? None       Objective:  Vitals:    04/23/18 1417   BP: 118/74   Pulse: 68   Weight: 121 lb (54.9 kg)   Height: 5' 3'' (1.6 m)     Gen: No acute distress, well appearing  Breasts: symmetric bilaterally, no cervical or axillary LAD. No nipple discharge. No skin changes. Soft, no discrete masses palpated.  Abd: Soft nontender, nondistended  Pelvic:  URETHRA: normal appearing urethra with no masses, tenderness or lesions  CLITORIS: normal appearing clitoris with no hypertrophy or enlargement  BARTHOLIN'S GLANDS: normal appearing Bartholin's glands with no masses, tenderness or erythema  VULVA: normal appearing vulva with no masses, tenderness or lesions  VAGINA: normal appearing vagina, estrogenized, no masses or no lesions  CERVIX: Normal appearing cervix without discharge or lesions. Non-tender. Scant white discharge.   UTERUS: uterus is normal size, shape, consistency and nontender  ADNEXA: No adnexal masses or tenderness to palpation  ANUS: no external hemorrhoids, fissures, or masses    Assessment and Plan:  1. Encounter for gynecological examination without abnormal finding    2. Screen for STD (sexually transmitted disease)      Pap /cotest collected  GC/CT and STD labs ordered per pt request  Vaginitis cultures collected.   We discussed options including side effects profiles for the following: OCPs, nuvraring, depoprovera, nexplanon, IUD (mirena vs. paraguard). Reviewed procedure in detail, risks/beneifts, and common side effects/bleeding profile.  RTC PRN    Orders Placed This Encounter   ??? Chlamydia trachomatis/Neisseria gonorrhoeae PCR, Genital   ??? HPV DNA PCR,Genital   ??? Fungal Culture, Genital Swab   ??? Bacterial Vaginosis Screen   ??? RPR   ??? Pap Smear   ??? Pap Smear   ??? Pap Smear   ??? HBS Antigen   ??? HIV-1/2 Ag/Ab 4th Generation with Reflex Confirmation   ??? HCV Antibody Screen     Total encounter time: 15???minutes. More than 50% of the time spent in counseling and coordination of care.    Electronically signed by:  Larey Seat, MD  [facility]

## 2018-04-25 LAB — RPR: RPR: NONREACTIVE

## 2018-04-25 LAB — Fungal Culture

## 2018-04-26 ENCOUNTER — Ambulatory Visit

## 2018-04-26 DIAGNOSIS — J0100 Acute maxillary sinusitis, unspecified: Secondary | ICD-10-CM

## 2018-04-26 DIAGNOSIS — S39012S Strain of muscle, fascia and tendon of lower back, sequela: Secondary | ICD-10-CM

## 2018-04-26 DIAGNOSIS — N898 Other specified noninflammatory disorders of vagina: Secondary | ICD-10-CM

## 2018-04-26 DIAGNOSIS — M549 Dorsalgia, unspecified: Secondary | ICD-10-CM

## 2018-04-26 LAB — HPV DNA PCR: HPV TYPE 16: NEGATIVE

## 2018-04-26 LAB — Fungal Culture

## 2018-04-26 MED ORDER — AMOXICILLIN-POT CLAVULANATE 875-125 MG PO TABS
1 | ORAL_TABLET | Freq: Two times a day (BID) | ORAL | 0 refills | Status: AC
Start: 2018-04-26 — End: ?

## 2018-04-26 NOTE — Progress Notes
[facility] Progress Note   Patient: Vanessa Jensen MRN: [id number] Date of Birth: September 07, 1985 Date of Service: 04/26/2018   Referring Provider: No ref. provider found Primary Care Provider: Pcp, No, MD  Chief Complaint   Sick Visit and Nasal Congestion    History of Present Illness   Vanessa Jensen is a 32 y.o. female who presents with Sick Visit and Nasal Congestion    Interval history  04/26/18      Nasal and sinus congestion persists  Mid back pain, right sided    04/22/18  Right mid back pain  Sore throat a week ago, nasal congestion and sneezing, cough with variable white and yellow sputum  Sore throat better, but some residual throat discomfort  No fevers or chills  Was on plane and friends also came down with similar symptoms    04/14/18  Was in [place] visiting friends and family for 2 weeks, feels better after trip, has better perspective  Mid back pain, worse on right   Also pain in arms and hands    03/16/18  Upper back pain, pain between shoulder blades, worse on right side  Saw 2 specialists, one advised muscular pain, other recommended thoracic joint injection scheduled for Friday, hesitant about procedure  Since last visit, grandmother passed away, moved out of negative living situation, sleeping at brother's place    08/20/17  Neck shoulder pain  Context of significant stress  Father passed aware in Sept, had to find way to pay for father's cremation  Broke left wrist in October, had to undergo surgery at [facility]  Tension built up in neck and shoulder    03/27/17  Right mid back pain, better with cupping, 5/10 pain today  Left shoulder pain  Left low back pain, 4-5/10, stiffness  Leg swelling worse at end of day  THC for sleep, but doesn't achieve deep sleep  Got posture corrector, also chrysanthemum tea    Initial intake 05/29/16  Fatigue, energy level low  Even with good sleep, 8h/night, less than 5/10  Context of insomnia for years, stopped trazadone  Uses THC, smoked, which helps  ??? Headaches since high school, saw neurologist at the time, but could not figure out the cause, diagnosed with chronic mild migraines, brain imaging negative  Since a month ago, headaches more severe, temporal, currently 4/10. Can be severe up to 8-9/10  Sharp pain, not so much throbbing  Occurring in context of neck pain  ???  Neck pain, assoc stiffness, worse over past month  Had TPI through immunologist, which helped, best relief for a couple days  Headaches improved a bit after neck pain improved  ???  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 Asthma    ??? CIN III (cervical intraepithelial neoplasia III)     s/p LEEP 12/16   ??? Depression with anxiety     prior suicide attempt   ??? History of alcohol  abuse     h/o alchohol withdrawls   ??? History of eating disorder    ??? History of pancreatitis 2001   ??? History of substance abuse    ??? Insomnia    ??? Migraines    ??? Sjogren's syndrome (HCC/RAF)    ??? Urticaria      Patient Active Problem List    Diagnosis Date Noted   ??? Sjogren's syndrome (HCC/RAF) 10/08/2016     Outpatient Medications Prior to Visit   Medication Sig   ??? beclomethasone 40 mcg/act inhaler Take 2 puffs by nebulization.   ??? cetirizine 5 mg tablet    ??? fluoxetine 20 mg capsule TAKE ONE CAOSULE BY MOUTH EVERY MORNING   ??? hydroxyzine 25 mg tablet    ??? mirtazapine 15 mg tablet Take 15 mg by mouth.   ??? mirtazapine 15 mg tablet TAKE 1 TABLET BY MOUTH EVERY DAY AT NIGHT ??? PATADAY 0.2 % ophthalmic solution    ??? trazodone 50 mg tablet TAKE 1 TO 3 TABLETS BY MOUTH AT BEDTIME AS NEEDED FOR INSOMNIA.   ??? albuterol 90 mcg/act inhaler Inhale 2 puffs every six (6) hours as needed. (Patient not taking: Reported on 04/23/2018.)   ??? benzonatate 100 mg capsule Take 2 capsules (200 mg total) by mouth three (3) times daily as needed for Cough. (Patient not taking: Reported on 04/23/2018.)   ??? hydroxychloroquine (PLAQUENIL) 200 mg tablet Take 1 tablet (200 mg total) by mouth daily. (Patient not taking: Reported on 04/23/2018.)   ??? HYDROXYCHLOROQUINE 200 mg tablet TAKE 1 TABLET BY MOUTH EVERY DAY (Patient not taking: Reported on 04/23/2018)   ??? loratadine-pseudoephedrine 10-240 mg tablet Take 1 tablet by mouth daily. (Patient not taking: Reported on 04/23/2018.)   ??? ondansetron 4 mg tablet Take 1 tablet (4 mg total) by mouth every six (6) hours as needed for Nausea. (Patient not taking: Reported on 04/23/2018.)     No facility-administered medications prior to visit.      Allergies     Allergies   Allergen Reactions   ??? Clindamycin    ??? Nitrofurantoin    ??? Tea      Green tea   ??? Fluconazole Hives   ??? Metronidazole Hives   ??? Sulfa Antibiotics Hives     Review of Systems   Const:  ENT: sinus congestion  Resp: cough    GI:    Musculo:  Mid back pain  Skin:    Neuro:    Psych:  stress  Physical Examination   BP 117/80  ~ Pulse 77  ~ Temp 36.7 ???C (98 ???F) (Temporal)  ~ Ht 5' 3'' (1.6 m)  ~ Wt 121 lb (54.9 kg)  ~ LMP 04/01/2018  ~ BMI 21.43 kg/m???   General: Well-appearing, no distress.  Eyes: Eyelids normal. Conjunctiva normal.   ENMT: External ears and nose normal. Edematous and erythematous nasal turbinates, TM clear  Neck: Neck symmetric.    Cardiovasc: No lower extremity edema.   Resp: CTAB, no w/r/r  Musculo: Tenderness to palpation as noted on annotated image.   Skin: Rashes none.  Psych: Mood and affect normal.  Trigger/Tender Point Identification: (see annotated image) Laboratory studies / Diagnostics / Imaging   Obtained and reviewed old records from [facility].    Assessment / Plan   Vanessa Jensen is a 32 y.o. female with  1. Acute non-recurrent maxillary sinusitis     2. Back strain, sequela  Inject TendonSheath/Ligament BP   3. Mid back pain  Acupuncture        Plan  #Sinusitis  -Pseudoephedrine for congestion  -Benzonatate for cough  -Augmentin 875/125mg  bid x10 days    #Mid back pain  -MFR cupping today  -Self massage recommended    #Back strain, sequela  -TPI today    Reviewed treatments, and treatment benefits, risks, alternatives with patient. acupuncture and TPI as discussed.     Acupuncture Points:  (du23 li20 li10 li4 sj5 st36 lv3 st40)   Acupuncture Treatment Duration: 20 Minutes  Injection of : Tendon Sheath with 0.86mL 1% lidocaine, Patient Tolerated Procedure Well  Injection with:: 0.30mL of 1% lidocaine  Injection with:: 0.2 mL      Latissimus dorsi (left)              Return in about 1 week (around 05/03/2018).       Vanessa Marcus, MD, MS  04/26/2018 5:17 PM    There are no Patient Instructions on file for this visit.

## 2018-04-27 LAB — Fungal Culture

## 2018-04-29 LAB — Chlamydia trachomatis/Neisseria gonorrhoeae PCR: NEISSERIA GONORRHOEAE PCR: NEGATIVE

## 2018-05-03 ENCOUNTER — Ambulatory Visit

## 2018-05-03 DIAGNOSIS — R05 Cough: Secondary | ICD-10-CM

## 2018-05-03 DIAGNOSIS — R102 Pelvic and perineal pain: Secondary | ICD-10-CM

## 2018-05-03 DIAGNOSIS — M549 Dorsalgia, unspecified: Secondary | ICD-10-CM

## 2018-05-03 DIAGNOSIS — S39012D Strain of muscle, fascia and tendon of lower back, subsequent encounter: Secondary | ICD-10-CM

## 2018-05-03 LAB — Liquid-based pap smear: LAB AP GYN INTERPRETATION: NEGATIVE

## 2018-05-03 MED ORDER — BENZONATATE 100 MG PO CAPS
200 mg | ORAL_CAPSULE | Freq: Three times a day (TID) | ORAL | 0 refills | Status: AC | PRN
Start: 2018-05-03 — End: ?

## 2018-05-03 NOTE — Progress Notes
[facility] Progress Note   Patient: Vanessa Jensen MRN: [id number] Date of Birth: [birth year] Date of Service: 05/03/2018   Referring Provider: No ref. provider found Primary Care Provider: Pcp, No, MD  Chief Complaint   Cough and Back Pain    History of Present Illness   Vanessa Jensen is a 32 y.o. female who presents with Cough and Back Pain    Interval history  05/03/18      Nasal and sinus congestion better since taking augmentin  States having pelvic pain, attributes to yeast infection, cannot take fluconazole, needs boric acid, in contact with gynecologist  Residual cough, decreased phlegm  Mid back pain, worse on right side, persists  Also left low back pain, radiating into left buttocks    04/26/18  Nasal and sinus congestion persists  Mid back pain, right sided    04/22/18  Right mid back pain  Sore throat a week ago, nasal congestion and sneezing, cough with variable white and yellow sputum  Sore throat better, but some residual throat discomfort  No fevers or chills  Was on plane and friends also came down with similar symptoms    04/14/18  Was in [place] visiting friends and family for 2 weeks, feels better after trip, has better perspective  Mid back pain, worse on right   Also pain in arms and hands    03/16/18  Upper back pain, pain between shoulder blades, worse on right side  Saw 2 specialists, one advised muscular pain, other recommended thoracic joint injection scheduled for Friday, hesitant about procedure  Since last visit, grandmother passed away, moved out of negative living situation, sleeping at brother's place    08/20/17  Neck shoulder pain  Context of significant stress  Father passed aware in Sept, had to find way to pay for father's cremation  Broke left wrist in October, had to undergo surgery at [facility]  Tension built up in neck and shoulder    03/27/17  Right mid back pain, better with cupping, 5/10 pain today  Left shoulder pain  Left low back pain, 4-5/10, stiffness Leg swelling worse at end of day  THC for sleep, but doesn't achieve deep sleep  Got posture corrector, also chrysanthemum tea    Initial intake 05/29/16  Fatigue, energy level low  Even with good sleep, 8h/night, less than 5/10  Context of insomnia for years, stopped trazadone  Uses THC, smoked, which helps  ???  Headaches since high school, saw neurologist at the time, but could not figure out the cause, diagnosed with chronic mild migraines, brain imaging negative  Since a month ago, headaches more severe, temporal, currently 4/10. Can be severe up to 8-9/10  Sharp pain, not so much throbbing  Occurring in context of neck pain  ???  Neck pain, assoc stiffness, worse over past month  Had TPI through immunologist, which helped, best relief for a couple days  Headaches improved a bit after neck pain improved  ???  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 Asthma    ??? CIN III (cervical intraepithelial neoplasia III)     s/p LEEP 12/16   ??? Depression with anxiety     prior suicide attempt   ??? History of alcohol abuse     h/o alchohol withdrawls   ??? History of eating disorder    ??? History of pancreatitis 2001   ??? History of substance abuse    ??? Insomnia    ??? Migraines    ??? Sjogren's syndrome (HCC/RAF)    ??? Urticaria      Patient Active Problem List    Diagnosis Date Noted   ??? Sjogren's syndrome (HCC/RAF) 10/08/2016     Outpatient Medications Prior to Visit   Medication Sig ??? amoxicillin-clavulanate 875-125 mg tablet Take 1 tablet by mouth two (2) times daily for 10 days.   ??? beclomethasone 40 mcg/act inhaler Take 2 puffs by nebulization.   ??? cetirizine 5 mg tablet    ??? fluoxetine 20 mg capsule TAKE ONE CAOSULE BY MOUTH EVERY MORNING   ??? hydroxyzine 25 mg tablet    ??? mirtazapine 15 mg tablet Take 15 mg by mouth.   ??? mirtazapine 15 mg tablet TAKE 1 TABLET BY MOUTH EVERY DAY AT NIGHT   ??? PATADAY 0.2 % ophthalmic solution    ??? trazodone 50 mg tablet TAKE 1 TO 3 TABLETS BY MOUTH AT BEDTIME AS NEEDED FOR INSOMNIA.   ??? albuterol 90 mcg/act inhaler Inhale 2 puffs every six (6) hours as needed. (Patient not taking: Reported on 04/23/2018.)   ??? hydroxychloroquine (PLAQUENIL) 200 mg tablet Take 1 tablet (200 mg total) by mouth daily. (Patient not taking: Reported on 04/23/2018.)   ??? HYDROXYCHLOROQUINE 200 mg tablet TAKE 1 TABLET BY MOUTH EVERY DAY (Patient not taking: Reported on 04/23/2018)   ??? loratadine-pseudoephedrine 10-240 mg tablet Take 1 tablet by mouth daily. (Patient not taking: Reported on 04/23/2018.)   ??? ondansetron 4 mg tablet Take 1 tablet (4 mg total) by mouth every six (6) hours as needed for Nausea. (Patient not taking: Reported on 04/23/2018.)   ??? benzonatate 100 mg capsule Take 2 capsules (200 mg total) by mouth three (3) times daily as needed for Cough. (Patient not taking: Reported on 04/23/2018.)     No facility-administered medications prior to visit.      Allergies     Allergies   Allergen Reactions   ??? Clindamycin    ??? Nitrofurantoin    ??? Tea      Green tea   ??? Fluconazole Hives   ??? Metronidazole Hives   ??? Sulfa Antibiotics Hives     Review of Systems   Const:  ENT:   Resp: cough    GI:    Gyn: pelvic pain  Musculo:  Mid back pain, low back pain  Skin:    Neuro:    Psych:  stress  Physical Examination   BP 109/71  ~ Pulse 81  ~ Temp 36.5 ???C (97.7 ???F) (Temporal)  ~ Ht 5' 3'' (1.6 m)  ~ Wt 121 lb (54.9 kg)  ~ BMI 21.43 kg/m???   General: Well-appearing, no distress. Eyes: Eyelids normal. Conjunctiva normal.   ENMT: External ears and nose normal.   Neck: Neck symmetric.    Cardiovasc: No lower extremity edema.   Musculo: Tenderness to palpation as noted on annotated image.   Skin: Rashes none.  Psych: Mood and affect normal.  Trigger/Tender Point Identification: (see annotated image)  No images are attached to the encounter.  Laboratory studies / Diagnostics / Imaging   Obtained and reviewed old records from [facility].    Assessment / Plan   Vanessa Jensen is a 32 y.o. female with  1. Mid back pain  Acupuncture    Myofascial Release Massage   2. Pelvic pain     3. Cough     4. Back strain, subsequent encounter  Inject TendonSheath/Ligament BP        Plan  #Mid back pain  -MFR cupping today  -Acupuncture today  -Self massage recommended    #Pelvic pain, attributes to yeast infection, allergy to fluconazole  -Follow up with gynecology for treatment    #Cough, improved, but persists  -Complete antibiotics  -Refill benzonatate    #Back strain, subsequent  -TPI today    Reviewed treatments, and treatment benefits, risks, alternatives with patient. Patients opts for today: MFR, acupuncture, TPI    Acupuncture Points:  (si13 ub11-19 ub42-44 si11 gb20)   Acupuncture Treatment Duration: 20 Minutes  Myofascial Release Massage Duration:  (15 minutes, cupping - mid back, left low back)  Injection of : Tendon Sheath with 0.36mL B12, Tendon Sheath with 0.21mL 1% lidocaine  Injection with:: 0.37mL of 1% lidocaine  Injection with:: 0.2 mL    Levator scapulae, Rhomboids Latissimus dorsi              Return in about 1 week (around 05/10/2018).       Corliss Marcus, MD, MS  05/03/2018 12:07 PM    There are no Patient Instructions on file for this visit.

## 2018-05-04 ENCOUNTER — Telehealth

## 2018-05-04 NOTE — Telephone Encounter
On 9/13      Spoke with patient and informed pt that she has yeast. She can take OTC monistat, or I can prescribe her diflucan oral pill. Patient states she has allergies to both of those medications. She will get boric acid over the counter (per [facility]) to take care of it. If she needs a prescription she will give a call back.

## 2018-05-05 ENCOUNTER — Ambulatory Visit: Attending: Rheumatology

## 2018-05-05 ENCOUNTER — Ambulatory Visit

## 2018-05-11 ENCOUNTER — Ambulatory Visit

## 2018-05-11 DIAGNOSIS — S29012S Strain of muscle and tendon of back wall of thorax, sequela: Secondary | ICD-10-CM

## 2018-05-11 DIAGNOSIS — M549 Dorsalgia, unspecified: Secondary | ICD-10-CM

## 2018-05-11 DIAGNOSIS — M7918 Myalgia, other site: Secondary | ICD-10-CM

## 2018-05-11 DIAGNOSIS — G8929 Other chronic pain: Secondary | ICD-10-CM

## 2018-05-11 DIAGNOSIS — M545 Low back pain: Secondary | ICD-10-CM

## 2018-05-11 NOTE — Progress Notes
[facility] Progress Note   Patient: Vanessa Jensen MRN: [id number] Date of Birth: 12/23/1985 Date of Service: 05/11/2018   Referring Provider: No ref. provider found Primary Care Provider: Pcp, No, MD  Chief Complaint   Back Pain    History of Present Illness   Vanessa Jensen is a 32 y.o. female who presents with Back Pain    Interval history  05/11/18      Mid back pain, worse on right  Low back pain, on left  Has been using boric acid and tea tree oil suppository for yeast infection    05/03/18  Nasal and sinus congestion better since taking augmentin  States having pelvic pain, attributes to yeast infection, cannot take fluconazole, needs boric acid, in contact with gynecologist  Residual cough, decreased phlegm  Mid back pain, worse on right side, persists  Also left low back pain, radiating into left buttocks    04/26/18  Nasal and sinus congestion persists  Mid back pain, right sided    04/22/18  Right mid back pain  Sore throat a week ago, nasal congestion and sneezing, cough with variable white and yellow sputum  Sore throat better, but some residual throat discomfort  No fevers or chills  Was on plane and friends also came down with similar symptoms    04/14/18  Was in [place] visiting friends and family for 2 weeks, feels better after trip, has better perspective  Mid back pain, worse on right   Also pain in arms and hands    Initial intake 05/29/16  Fatigue, energy level low  Even with good sleep, 8h/night, less than 5/10  Context of insomnia for years, stopped trazadone  Uses THC, smoked, which helps  ???  Headaches since high school, saw neurologist at the time, but could not figure out the cause, diagnosed with chronic mild migraines, brain imaging negative  Since a month ago, headaches more severe, temporal, currently 4/10. Can be severe up to 8-9/10  Sharp pain, not so much throbbing  Occurring in context of neck pain  ???  Neck pain, assoc stiffness, worse over past month Had TPI through immunologist, which helped, best relief for a couple days  Headaches improved a bit after neck pain improved  ???  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 Asthma    ??? CIN III (cervical intraepithelial neoplasia III)     s/p LEEP 12/16   ??? Depression with anxiety     prior suicide attempt   ??? History of alcohol abuse     h/o alchohol withdrawls   ??? History of eating disorder    ??? History of pancreatitis 2001   ??? History of substance abuse    ??? Insomnia    ??? Migraines    ??? Sjogren's syndrome (HCC/RAF)    ??? Urticaria      Patient Active Problem List    Diagnosis Date Noted   ???  Sjogren's syndrome (HCC/RAF) 10/08/2016     Outpatient Medications Prior to Visit   Medication Sig   ??? beclomethasone 40 mcg/act inhaler Take 2 puffs by nebulization.   ??? benzonatate 100 mg capsule Take 2 capsules (200 mg total) by mouth three (3) times daily as needed for Cough.   ??? cetirizine 5 mg tablet    ??? fluoxetine 20 mg capsule TAKE ONE CAOSULE BY MOUTH EVERY MORNING   ??? hydroxyzine 25 mg tablet    ??? mirtazapine 15 mg tablet Take 15 mg by mouth.   ??? mirtazapine 15 mg tablet TAKE 1 TABLET BY MOUTH EVERY DAY AT NIGHT   ??? PATADAY 0.2 % ophthalmic solution    ??? trazodone 50 mg tablet TAKE 1 TO 3 TABLETS BY MOUTH AT BEDTIME AS NEEDED FOR INSOMNIA.   ??? albuterol 90 mcg/act inhaler Inhale 2 puffs every six (6) hours as needed. (Patient not taking: Reported on 04/23/2018.) ??? hydroxychloroquine (PLAQUENIL) 200 mg tablet Take 1 tablet (200 mg total) by mouth daily. (Patient not taking: Reported on 04/23/2018.)   ??? HYDROXYCHLOROQUINE 200 mg tablet TAKE 1 TABLET BY MOUTH EVERY DAY (Patient not taking: Reported on 04/23/2018)   ??? loratadine-pseudoephedrine 10-240 mg tablet Take 1 tablet by mouth daily. (Patient not taking: Reported on 04/23/2018.)   ??? ondansetron 4 mg tablet Take 1 tablet (4 mg total) by mouth every six (6) hours as needed for Nausea. (Patient not taking: Reported on 04/23/2018.)     No facility-administered medications prior to visit.      Allergies     Allergies   Allergen Reactions   ??? Clindamycin    ??? Nitrofurantoin    ??? Tea      Green tea   ??? Fluconazole Hives   ??? Metronidazole Hives   ??? Sulfa Antibiotics Hives     Review of Systems   Const:  ENT:   Resp:   GI:    Gyn:   Musculo:  Mid back pain, low back pain  Skin:    Neuro:    Psych:  stress  Physical Examination   BP 94/56  ~ Pulse 57  ~ Temp 36.7 ???C (98 ???F) (Temporal)  ~ Ht 5' 3'' (1.6 m)  ~ Wt 121 lb (54.9 kg)  ~ BMI 21.43 kg/m???   General: Well-appearing, no distress.  Eyes: Eyelids normal. Conjunctiva normal.   ENMT: External ears and nose normal.   Neck: Neck symmetric.    Cardiovasc: No lower extremity edema.   Musculo: Tenderness to palpation as noted on annotated image.   Skin: Rashes none.  Psych: Mood and affect normal.  Trigger/Tender Point Identification: (see annotated image)  No images are attached to the encounter.  Laboratory studies / Diagnostics / Imaging   Obtained and reviewed old records from [facility].    Assessment / Plan   Vanessa Jensen is a 32 y.o. female with  1. Strain of mid-back, sequela  Inject TendonSheath/Ligament BP   2. Mid back pain  Acupuncture    Myofascial Release Massage   3. Chronic left-sided low back pain without sciatica     4. Myofascial pain  Myofascial Release Massage        Plan  #Mid back strain, sequela  -TPI today    #Mid back pain  -MFR cupping today -Acupuncture today  -Self massage recommended    #Chronic low back pain, left-sided  -Follow up spine surgery as per patient    #Myofascial pain  -Recommend self massage, stretching, and  thermotherapy.     Reviewed treatments, and treatment benefits, risks, alternatives with patient. Patients opts for today: MFR, acupuncture, TPI    Acupuncture Points:  (gb20 gb21 si15 ub13-17 HTJJ T3-7)   Acupuncture Treatment Duration: 20 Minutes  Myofascial Release Massage Duration:  (15 minutes, cupping - mid back)  Injection of : Tendon Sheath with 0.36mL 1% lidocaine, Patient Tolerated Procedure Well  Injection with:: 0.32mL of 1% lidocaine  Injection with:: 0.2 mL  Trapezius, Splenius capitis Rhomboids, Levator scapulae                Return in about 1 week (around 05/18/2018).       Corliss Marcus, MD, MS  05/11/2018 8:22 AM    There are no Patient Instructions on file for this visit.

## 2018-05-19 ENCOUNTER — Ambulatory Visit: Payer: BLUE CROSS/BLUE SHIELD

## 2018-05-24 ENCOUNTER — Ambulatory Visit: Payer: BLUE CROSS/BLUE SHIELD

## 2018-05-24 DIAGNOSIS — M549 Dorsalgia, unspecified: Secondary | ICD-10-CM

## 2018-05-24 DIAGNOSIS — S29012S Strain of muscle and tendon of back wall of thorax, sequela: Secondary | ICD-10-CM

## 2018-05-24 DIAGNOSIS — J029 Acute pharyngitis, unspecified: Secondary | ICD-10-CM

## 2018-05-24 NOTE — Progress Notes
[facility] Progress Note   Patient: Vanessa Jensen MRN: [id number] Date of Birth: 29-Apr-1986 Date of Service: 05/24/2018   Referring Provider: No ref. provider found Primary Care Provider: Pcp, No, MD  Chief Complaint   Back Pain    History of Present Illness   Vanessa Jensen is a 32 y.o. female who presents with Back Pain    Interval history  05/24/18        Sore throat, trace cough, throat feels swollen  Mid back pain persists, worse on right    05/11/18  Mid back pain, worse on right  Low back pain, on left  Has been using boric acid and tea tree oil suppository for yeast infection    05/03/18  Nasal and sinus congestion better since taking augmentin  States having pelvic pain, attributes to yeast infection, cannot take fluconazole, needs boric acid, in contact with gynecologist  Residual cough, decreased phlegm  Mid back pain, worse on right side, persists  Also left low back pain, radiating into left buttocks    04/26/18  Nasal and sinus congestion persists  Mid back pain, right sided    04/22/18  Right mid back pain  Sore throat a week ago, nasal congestion and sneezing, cough with variable white and yellow sputum  Sore throat better, but some residual throat discomfort  No fevers or chills  Was on plane and friends also came down with similar symptoms    04/14/18  Was in [place] visiting friends and family for 2 weeks, feels better after trip, has better perspective  Mid back pain, worse on right   Also pain in arms and hands    Initial intake 05/29/16  Fatigue, energy level low  Even with good sleep, 8h/night, less than 5/10  Context of insomnia for years, stopped trazadone  Uses THC, smoked, which helps  ???  Headaches since high school, saw neurologist at the time, but could not figure out the cause, diagnosed with chronic mild migraines, brain imaging negative  Since a month ago, headaches more severe, temporal, currently 4/10. Can be severe up to 8-9/10  Sharp pain, not so much throbbing Occurring in context of neck pain  ???  Neck pain, assoc stiffness, worse over past month  Had TPI through immunologist, which helped, best relief for a couple days  Headaches improved a bit after neck pain improved  ???  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 Asthma    ??? CIN III (cervical intraepithelial neoplasia III)     s/p LEEP 12/16   ??? Depression with anxiety     prior suicide attempt   ??? History of alcohol abuse     h/o alchohol withdrawls   ??? History of eating disorder    ??? History of pancreatitis 2001   ??? History of substance abuse    ??? Insomnia    ??? Migraines    ??? Sjogren's syndrome (HCC/RAF)    ???  Urticaria      Patient Active Problem List    Diagnosis Date Noted   ??? Sjogren's syndrome (HCC/RAF) 10/08/2016     Outpatient Medications Prior to Visit   Medication Sig   ??? beclomethasone 40 mcg/act inhaler Take 2 puffs by nebulization.   ??? cetirizine 5 mg tablet    ??? fluoxetine 20 mg capsule TAKE ONE CAOSULE BY MOUTH EVERY MORNING   ??? hydroxyzine 25 mg tablet    ??? mirtazapine 15 mg tablet Take 15 mg by mouth.   ??? mirtazapine 15 mg tablet TAKE 1 TABLET BY MOUTH EVERY DAY AT NIGHT   ??? PATADAY 0.2 % ophthalmic solution    ??? trazodone 50 mg tablet TAKE 1 TO 3 TABLETS BY MOUTH AT BEDTIME AS NEEDED FOR INSOMNIA.   ??? albuterol 90 mcg/act inhaler Inhale 2 puffs every six (6) hours as needed. (Patient not taking: Reported on 04/23/2018.) ??? hydroxychloroquine (PLAQUENIL) 200 mg tablet Take 1 tablet (200 mg total) by mouth daily. (Patient not taking: Reported on 04/23/2018.)   ??? HYDROXYCHLOROQUINE 200 mg tablet TAKE 1 TABLET BY MOUTH EVERY DAY (Patient not taking: Reported on 04/23/2018)   ??? loratadine-pseudoephedrine 10-240 mg tablet Take 1 tablet by mouth daily. (Patient not taking: Reported on 04/23/2018.)   ??? ondansetron 4 mg tablet Take 1 tablet (4 mg total) by mouth every six (6) hours as needed for Nausea. (Patient not taking: Reported on 04/23/2018.)     No facility-administered medications prior to visit.      Allergies     Allergies   Allergen Reactions   ??? Clindamycin    ??? Nitrofurantoin    ??? Tea      Green tea   ??? Fluconazole Hives   ??? Metronidazole Hives   ??? Sulfa Antibiotics Hives     Review of Systems   Const:  ENT: sore throat  Resp:   GI:    Gyn:   Musculo:  Mid back pain, low back pain  Skin:    Neuro:    Psych:  stress  Physical Examination   BP 109/69  ~ Pulse 60  ~ Temp 36.7 ???C (98.1 ???F) (Temporal)  ~ Ht 5' 3'' (1.6 m)  ~ Wt 121 lb (54.9 kg)  ~ BMI 21.43 kg/m???   General: Well-appearing, no distress.  Eyes: Eyelids normal. Conjunctiva normal.   ENMT: External ears and nose normal.   Neck: Neck symmetric.    Cardiovasc: No lower extremity edema.   Musculo: Tenderness to palpation as noted on annotated image.   Skin: Rashes none.  Psych: Mood and affect normal.  Trigger/Tender Point Identification: (see annotated image)      Laboratory studies / Diagnostics / Imaging   Obtained and reviewed old records from [facility].    Assessment / Plan   Vanessa Jensen is a 32 y.o. female with  1. Sore throat  Group A Streptococcus Direct    Group A Streptococcus Direct   2. Mid back pain  Myofascial Release Massage    Acupuncture   3. Strain of rhomboid muscle, sequela  Inject TendonSheath/Ligament BP        Plan  #Sore throat  -Throat culture  -Chuan bei pi pa gao syrup for throat discomfort    #Rhomboid strain, sequela  -TPI today    #Mid back pain -MFR cupping today  -Acupuncture today  -Self massage recommended    #Chronic low back pain, left-sided  -Follow up spine surgery as per patient    #Myofascial  pain  -Recommend self massage, stretching, and thermotherapy.     Reviewed treatments, and treatment benefits, risks, alternatives with patient. Patients opts for today: MFR, acupuncture, TPI    Acupuncture Points:  (gb20 gb21 si15 ub11, R si13 ub41-45 ub11-17)   Acupuncture Treatment Duration: 20 Minutes  Myofascial Release Massage Duration:  (15 minutes, cupping - neck shoulder upper and mid back)  Injection of : Tendon Sheath with 0.61mL 1% lidocaine, Patient Tolerated Procedure Well  Injection with:: 0.29mL of 1% lidocaine  Injection with:: 0.2 mL    Rhomboids, Levator scapulae (right) Latissimus dorsi (right)              Return in about 1 week (around 05/31/2018).       Corliss Marcus, MD, MS  05/24/2018 8:51 PM    Patient Instructions   Nin Minerva Areola Pa Koa - Sore Throat Syrup    Useful for sore throat, cough    Take 1 tablespoon by mouth 3 times a day    May be purchased through [email or web address] or at [address] (on [address] in [place])

## 2018-05-24 NOTE — Patient Instructions
Nin Jiom Pei Pa Koa - Sore Throat Syrup    Useful for sore throat, cough    Take 1 tablespoon by mouth 3 times a day    May be purchased through [email or web address] or at [address] (on [address] in [place])

## 2018-05-25 LAB — Group A Streptococcus Direct: GROUP A STREPTOCOCCUS DIRECT: NEGATIVE

## 2018-05-31 ENCOUNTER — Ambulatory Visit: Payer: BLUE CROSS/BLUE SHIELD

## 2018-05-31 DIAGNOSIS — S39012S Strain of muscle, fascia and tendon of lower back, sequela: Secondary | ICD-10-CM

## 2018-05-31 DIAGNOSIS — M549 Dorsalgia, unspecified: Secondary | ICD-10-CM

## 2018-05-31 DIAGNOSIS — J45909 Unspecified asthma, uncomplicated: Secondary | ICD-10-CM

## 2018-05-31 DIAGNOSIS — S29012S Strain of muscle and tendon of back wall of thorax, sequela: Secondary | ICD-10-CM

## 2018-05-31 MED ORDER — IBUPROFEN 600 MG PO TABS
600 mg | ORAL_TABLET | Freq: Four times a day (QID) | ORAL | 0 refills | Status: AC | PRN
Start: 2018-05-31 — End: 2018-12-01

## 2018-05-31 MED ORDER — ALBUTEROL SULFATE HFA 108 (90 BASE) MCG/ACT IN AERS
2 | Freq: Four times a day (QID) | RESPIRATORY_TRACT | 0 refills | Status: AC | PRN
Start: 2018-05-31 — End: 2018-07-22

## 2018-05-31 NOTE — Patient Instructions
Cane massager  Shoulder stretches  Albuterol, ibuprofen as directed

## 2018-05-31 NOTE — Progress Notes
[facility] Progress Note   Patient: Vanessa Jensen MRN: [id number] Date of Birth: [birth year] Date of Service: 05/31/2018   Referring Provider: No ref. provider found Primary Care Provider: Pcp, No, MD  Chief Complaint   mid back pain and Shoulder Pain    History of Present Illness   Vanessa Jensen is a 32 y.o. female who presents with mid back pain and Shoulder Pain    Interval history  05/24/18  Pain Location: Upper, Mid, Lower, Back, Right, Shoulder  Pain: Stable  Sleep: Stable  Acupuncture Comments: Last Tx: good, sore throat resolved. CC: R upper/mid back pain, L LBP, PMS. R upper/mid back pain persists, discomfort rated 4/10, better with resting over the weekend but stiff from not moving around much, radiates down to R low back. L LBP feels stuck and ''pinchy'', rated 7/10, working on it at PT. Today is day 3 of period, experiencing mood swings, lower abdominal cramps, and temporal HAs. Self care includes PT.     Sore throat resolved - attributes to detoxing from supplements  Right shoulder and mid back pain, pain along medial scapula  Doing stretches, discussed cane massager  Sees PT  Requests refill on albuterol inhaler today  Requests refill on ibuprofen which she takes as needed  Stopped meloxicam since was not helpful  Discussed trigger point injections (TPIs) with associated risks/benefits, patient would like to receive TPIs today for mid back and shoulder strain      05/11/18  Mid back pain, worse on right  Low back pain, on left  Has been using boric acid and tea tree oil suppository for yeast infection    05/03/18  Nasal and sinus congestion better since taking augmentin  States having pelvic pain, attributes to yeast infection, cannot take fluconazole, needs boric acid, in contact with gynecologist  Residual cough, decreased phlegm  Mid back pain, worse on right side, persists  Also left low back pain, radiating into left buttocks    04/26/18  Nasal and sinus congestion persists Mid back pain, right sided    04/22/18  Right mid back pain  Sore throat a week ago, nasal congestion and sneezing, cough with variable white and yellow sputum  Sore throat better, but some residual throat discomfort  No fevers or chills  Was on plane and friends also came down with similar symptoms    04/14/18  Was in [place] visiting friends and family for 2 weeks, feels better after trip, has better perspective  Mid back pain, worse on right   Also pain in arms and hands    Initial intake 05/29/16  Fatigue, energy level low  Even with good sleep, 8h/night, less than 5/10  Context of insomnia for years, stopped trazadone  Uses THC, smoked, which helps  ???  Headaches since high school, saw neurologist at the time, but could not figure out the cause, diagnosed with chronic mild migraines, brain imaging negative  Since a month ago, headaches more severe, temporal, currently 4/10. Can be severe up to 8-9/10  Sharp pain, not so much throbbing  Occurring in context of neck pain  ???  Neck pain, assoc stiffness, worse over past month  Had TPI through immunologist, which helped, best relief for a couple days  Headaches improved a bit after neck pain improved  ???  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 Asthma    ??? CIN III (cervical intraepithelial neoplasia III)     s/p LEEP 12/16   ??? Depression with anxiety     prior suicide attempt   ??? History of alcohol abuse     h/o alchohol withdrawls ??? History of eating disorder    ??? History of pancreatitis 2001   ??? History of substance abuse    ??? Insomnia    ??? Migraines    ??? Sjogren's syndrome (HCC/RAF)    ??? Urticaria      Patient Active Problem List    Diagnosis Date Noted   ??? Sjogren's syndrome (HCC/RAF) 10/08/2016     Outpatient Medications Prior to Visit   Medication Sig   ??? beclomethasone 40 mcg/act inhaler Take 2 puffs by nebulization.   ??? cetirizine 5 mg tablet    ??? fluoxetine 20 mg capsule TAKE ONE CAOSULE BY MOUTH EVERY MORNING   ??? hydroxyzine 25 mg tablet    ??? mirtazapine 15 mg tablet Take 15 mg by mouth.   ??? mirtazapine 15 mg tablet TAKE 1 TABLET BY MOUTH EVERY DAY AT NIGHT   ??? PATADAY 0.2 % ophthalmic solution    ??? trazodone 50 mg tablet TAKE 1 TO 3 TABLETS BY MOUTH AT BEDTIME AS NEEDED FOR INSOMNIA.   ??? hydroxychloroquine (PLAQUENIL) 200 mg tablet Take 1 tablet (200 mg total) by mouth daily. (Patient not taking: Reported on 04/23/2018.)   ??? HYDROXYCHLOROQUINE 200 mg tablet TAKE 1 TABLET BY MOUTH EVERY DAY (Patient not taking: Reported on 04/23/2018)   ??? loratadine-pseudoephedrine 10-240 mg tablet Take 1 tablet by mouth daily. (Patient not taking: Reported on 04/23/2018.)   ??? ondansetron 4 mg tablet Take 1 tablet (4 mg total) by mouth every six (6) hours as needed for Nausea. (Patient not taking: Reported on 04/23/2018.)   ??? albuterol 90 mcg/act inhaler Inhale 2 puffs every six (6) hours as needed. (Patient not taking: Reported on 04/23/2018.)     No facility-administered medications prior to visit.      Allergies     Allergies   Allergen Reactions   ??? Clindamycin    ??? Nitrofurantoin    ??? Tea      Green tea   ??? Fluconazole Hives   ??? Metronidazole Hives   ??? Sulfa Antibiotics Hives     Review of Systems   Const:  ENT: sore throat  Resp:   GI:    Gyn:   Musculo:  Mid back pain, low back pain  Skin:    Neuro:    Psych:  stress  Physical Examination   BP 107/69  ~ Pulse 56  ~ Temp 37 ???C (98.6 ???F) (Temporal)  ~ Ht 5' 3'' (1.6 m)  ~ Wt 121 lb (54.9 kg)  ~ BMI 21.43 kg/m???   General: Well-appearing, no distress; no audible wheezing, no resp distress  Eyes: Eyelids normal. Conjunctiva normal.   ENMT: External ears and nose normal.   Neck: Neck symmetric.    Cardiovasc: No lower extremity edema.   Musculo: Tenderness to palpation as noted on annotated  image.   Skin: Rashes none.  Psych: Mood and affect normal.  Trigger/Tender Point Identification: (see annotated image)        Laboratory studies / Diagnostics / Imaging   Obtained and reviewed old records from [facility].    Assessment / Plan   Vanessa Jensen is a 32 y.o. female with  1. Strain of muscle and tendon of back wall of thorax, sequela  Myofascial Release Massage    Acupuncture   2. Mid back pain  Myofascial Release Massage    Acupuncture   3. Back strain, sequela  Myofascial Release Massage    Acupuncture   4. Asthma, unspecified asthma severity, unspecified whether complicated, unspecified whether persistent  Myofascial Release Massage    Acupuncture        Plan  #right shoulder, mid back strain, sequela  -TPI today    #Mid back pain  -MFR cupping today  -Acupuncture today  -cane massager   -continue stretches    #Chronic low back pain, left-sided  -Follow up spine surgery as per patient    #Myofascial pain  -Recommend self massage, stretching, and thermotherapy.  -ibuprofen refill provided as requested     #Asthma - stable  -albuterol inhaler refill given as requested  -establish PCP        Reviewed treatments, and treatment benefits, risks, alternatives with patient. Patients opts for today: MFR, acupuncture, TPI    Acupuncture Points:  (KD3, GB41, GB20, GB21, UB15, UB18, UB20, UB23, UB31, SI11, Latissimus Dorsi Ashi, QL Ashi; (L) Yaoyan, Iliocostalis Ashi; (R) Teres Minor Ashi, SI10, Teres Major Ashi, Rhomboid Ashi)   Acupuncture Treatment Duration: 20 Minutes  Myofascial Release Massage Duration: 8 Min (ST. CUPPING: neck, shoulders, upper/mid/lower back) Injection of : Tendon Sheath with 0.17mL 1% lidocaine, Patient Tolerated Procedure Well (right)       Rhomboids, Levator scapulae Latissimus dorsi              Return in about 2 weeks (around 06/14/2018).     F/u rheum as appropriate  Continue/taper analgesic pharmacotherapy    Vanessa Ditommaso B. Leane Call, MD  05/31/2018 10:51 AM    Patient Instructions   Cane massager  Shoulder stretches  Albuterol, ibuprofen as directed

## 2018-06-07 ENCOUNTER — Ambulatory Visit: Payer: BLUE CROSS/BLUE SHIELD

## 2018-06-07 DIAGNOSIS — M549 Dorsalgia, unspecified: Secondary | ICD-10-CM

## 2018-06-07 DIAGNOSIS — M545 Low back pain: Secondary | ICD-10-CM

## 2018-06-07 DIAGNOSIS — M7918 Myalgia, other site: Secondary | ICD-10-CM

## 2018-06-07 DIAGNOSIS — S29012S Strain of muscle and tendon of back wall of thorax, sequela: Secondary | ICD-10-CM

## 2018-06-07 DIAGNOSIS — G8929 Other chronic pain: Secondary | ICD-10-CM

## 2018-06-07 NOTE — Patient Instructions
F/u PT  Cane massager    Shoulder stretches  Albuterol, ibuprofen as directed

## 2018-06-07 NOTE — Progress Notes
[facility] Progress Note   Patient: Vanessa Jensen MRN: [id number] Date of Birth: 01/24/1986 Date of Service: 06/07/2018   Referring Provider: No ref. provider found Primary Care Provider: Pcp, No, MD  Chief Complaint   Shoulder Pain and Back Pain    History of Present Illness   Vanessa Jensen is a 32 y.o. female who presents with Shoulder Pain and Back Pain    Interval history  06/07/18  Pain Location: Left, Lower, Back, Right, Shoulder  Pain: Stable  Sleep: Stable  Acupuncture Comments: Last Tx: good, pain relief for a few days. CC: L LBP, R upper/mid back pain. L LBP still feels stuck and ''pinchy,'' rated 8/10 (last tx 7/10), working on it at PT. R upper/mid back pain stable, rated 4/10, ''annoying'' discomfort. Stress: anxiety increased, doing more breath work lately which helps. Self care includes PT, stretching, deep breathing. Recommended self massage of psoas.     Right mid back pain, medial to scapula  Chronic left low back pain  Last treatment helped pain for few days  Seeing PT which is working on both areas  Has not obtained cane massager  Would like to continue TPIs today      05/31/18  Sore throat resolved - attributes to detoxing from supplements  Right shoulder and mid back pain, pain along medial scapula  Doing stretches, discussed cane massager  Sees PT  Requests refill on albuterol inhaler today  Requests refill on ibuprofen which she takes as needed  Stopped meloxicam since was not helpful  Discussed trigger point injections (TPIs) with associated risks/benefits, patient would like to receive TPIs today for mid back and shoulder strain      05/11/18  Mid back pain, worse on right  Low back pain, on left  Has been using boric acid and tea tree oil suppository for yeast infection    05/03/18  Nasal and sinus congestion better since taking augmentin  States having pelvic pain, attributes to yeast infection, cannot take fluconazole, needs boric acid, in contact with gynecologist Residual cough, decreased phlegm  Mid back pain, worse on right side, persists  Also left low back pain, radiating into left buttocks    04/26/18  Nasal and sinus congestion persists  Mid back pain, right sided    04/22/18  Right mid back pain  Sore throat a week ago, nasal congestion and sneezing, cough with variable white and yellow sputum  Sore throat better, but some residual throat discomfort  No fevers or chills  Was on plane and friends also came down with similar symptoms    04/14/18  Was in [place] visiting friends and family for 2 weeks, feels better after trip, has better perspective  Mid back pain, worse on right   Also pain in arms and hands    Initial intake 05/29/16  Fatigue, energy level low  Even with good sleep, 8h/night, less than 5/10  Context of insomnia for years, stopped trazadone  Uses THC, smoked, which helps  ???  Headaches since high school, saw neurologist at the time, but could not figure out the cause, diagnosed with chronic mild migraines, brain imaging negative  Since a month ago, headaches more severe, temporal, currently 4/10. Can be severe up to 8-9/10  Sharp pain, not so much throbbing  Occurring in context of neck pain  ???  Neck pain, assoc stiffness, worse over past month  Had TPI through immunologist, which helped, best relief for a couple days  Headaches improved a bit after neck  pain improved  ???  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  Sleep: Difficulty staying asleep  Stress: Laid off from works, was in [employer]; may have job opportunity in [place] in pharmaceuticals - hopeful but also anxious about idea of moving again  BM: constipation  Digestion: bloating, gassy after eating  Diet: cutting out sugar, wheat, dairy; has low acid coffee in morning, macrogreen smoothies with ginger, does not always have lunch or dinner, walnuts, pumpkins, wraps; feels  Menses: on nuvaring for a year, light periods, bright red, 2 days    Past Medical History:   Diagnosis Date   ??? Asthma ??? CIN III (cervical intraepithelial neoplasia III)     s/p LEEP 12/16   ??? Depression with anxiety     prior suicide attempt   ??? History of alcohol abuse     h/o alchohol withdrawls   ??? History of eating disorder    ??? History of pancreatitis 2001   ??? History of substance abuse (HCC/RAF)    ??? Insomnia    ??? Migraines    ??? Sjogren's syndrome (HCC/RAF)    ??? Urticaria      Patient Active Problem List    Diagnosis Date Noted   ??? Sjogren's syndrome (HCC/RAF) 10/08/2016     Outpatient Medications Prior to Visit   Medication Sig   ??? albuterol 90 mcg/act inhaler Inhale 2 puffs every six (6) hours as needed.   ??? beclomethasone 40 mcg/act inhaler Take 2 puffs by nebulization.   ??? cetirizine 5 mg tablet    ??? fluoxetine 20 mg capsule TAKE ONE CAOSULE BY MOUTH EVERY MORNING   ??? hydroxychloroquine (PLAQUENIL) 200 mg tablet Take 1 tablet (200 mg total) by mouth daily.   ??? HYDROXYCHLOROQUINE 200 mg tablet TAKE 1 TABLET BY MOUTH EVERY DAY   ??? hydroxyzine 25 mg tablet    ??? ibuprofen 600 mg tablet Take 1 tablet (600 mg total) by mouth every six (6) hours as needed.   ??? loratadine-pseudoephedrine 10-240 mg tablet Take 1 tablet by mouth daily.   ??? mirtazapine 15 mg tablet Take 15 mg by mouth.   ??? mirtazapine 15 mg tablet TAKE 1 TABLET BY MOUTH EVERY DAY AT NIGHT   ??? ondansetron 4 mg tablet Take 1 tablet (4 mg total) by mouth every six (6) hours as needed for Nausea.   ??? PATADAY 0.2 % ophthalmic solution    ??? trazodone 50 mg tablet TAKE 1 TO 3 TABLETS BY MOUTH AT BEDTIME AS NEEDED FOR INSOMNIA.     No facility-administered medications prior to visit.      Allergies     Allergies   Allergen Reactions   ??? Clindamycin    ??? Nitrofurantoin    ??? Tea      Green tea   ??? Fluconazole Hives   ??? Metronidazole Hives   ??? Sulfa Antibiotics Hives     Review of Systems   Const:  ENT: sore throat  Resp:   GI:    Gyn:   Musculo:  Mid back pain, low back pain  Skin:    Neuro:    Psych:  stress  Physical Examination BP 91/54  ~ Pulse 52  ~ Temp 36.4 ???C (97.6 ???F) (Temporal)  ~ Ht 5' 3'' (1.6 m)  ~ Wt 121 lb (54.9 kg)  ~ BMI 21.43 kg/m???   General: Well-appearing, no distress; no audible wheezing, no resp distress  Eyes: Eyelids normal. Conjunctiva normal.   ENMT:  External ears and nose normal.   Neck: Neck symmetric.    Cardiovasc: No lower extremity edema.   Musculo: Tenderness to palpation as noted on annotated image.   Skin: Rashes none.  Psych: Mood and affect normal.  Trigger/Tender Point Identification: (see annotated image)        Laboratory studies / Diagnostics / Imaging   Obtained and reviewed old records from [facility].    Assessment / Plan   Liticia Gasior is a 32 y.o. female with  1. Strain of muscle and tendon of back wall of thorax, sequela  Myofascial Release Massage    Acupuncture    Inject TendonSheath/Ligament BP   2. Mid back pain  Myofascial Release Massage    Acupuncture    Inject TendonSheath/Ligament BP   3. Chronic left-sided low back pain without sciatica  Myofascial Release Massage    Acupuncture    Inject TendonSheath/Ligament BP   4. Myofascial pain  Myofascial Release Massage    Acupuncture    Inject TendonSheath/Ligament BP        Plan  #right shoulder, mid back strain, sequela  -TPI today    #Mid back pain- stable  -MFR cupping today  -Acupuncture today  -cane massager   -continue stretches  -f/u PT    #Chronic low back pain, left-sided - stable  -Follow up spine surgery as per patient  -f/u PT    #Myofascial pain  -Recommend self massage, stretching, and thermotherapy.  -ibuprofen as needed    #Asthma - stable  -albuterol inhaler  -establish PCP        Reviewed treatments, and treatment benefits, risks, alternatives with patient. Patients opts for today: MFR, acupuncture, TPI    Acupuncture Points:  (KD3, UB15, UB18, UB23, L5 HTJJ; (R) GB41, Latissimus Dorsi Ashi, Teres Major Ashi, Teres Minor Ashi, SI12, SI11, SI10, SI9, Mid Back Ashi x4, GB21; (L) QL Ashi x2, SI Joint Ashi x2, Glute Ashi, Piriformis Ashi; (Not Retained: (L) Psoas Ashi, TFL Ashi))   Acupuncture Treatment Duration: 20 Minutes  Myofascial Release Massage Duration: 8 Min (ST. CUPPING: neck, shoulders, low back, L glutes)  Injection of : Tendon Sheath with 0.38mL 1% lidocaine, Patient Tolerated Procedure Well (right)       Rhomboids Latissimus dorsi              Return in about 1 week (around 06/14/2018).     F/u rheum as appropriate  Continue/taper analgesic pharmacotherapy    Patient was seen and examined with Barton Dubois, LAc.  I was present/available on site while procedure(s) was/were performed and agree with findings/assessment/plan we formulated together as documented above.    Zerah Hilyer B. Leane Call, MD  06/07/2018 3:11 PM    Patient Instructions   F/u PT  Cane massager    Shoulder stretches  Albuterol, ibuprofen as directed

## 2018-06-14 ENCOUNTER — Ambulatory Visit: Payer: BLUE CROSS/BLUE SHIELD

## 2018-06-14 DIAGNOSIS — M549 Dorsalgia, unspecified: Secondary | ICD-10-CM

## 2018-06-14 DIAGNOSIS — M7918 Myalgia, other site: Secondary | ICD-10-CM

## 2018-06-14 DIAGNOSIS — S29012S Strain of muscle and tendon of back wall of thorax, sequela: Secondary | ICD-10-CM

## 2018-06-14 DIAGNOSIS — G8929 Other chronic pain: Secondary | ICD-10-CM

## 2018-06-14 DIAGNOSIS — S76012S Strain of muscle, fascia and tendon of left hip, sequela: Secondary | ICD-10-CM

## 2018-06-14 DIAGNOSIS — M545 Low back pain: Secondary | ICD-10-CM

## 2018-06-14 NOTE — Progress Notes
[facility] Progress Note   Patient: Vanessa Jensen MRN: [id number] Date of Birth: Jun 04, 1986 Date of Service: 06/14/2018   Referring Provider: No ref. provider found Primary Care Provider: Pcp, No, MD  Chief Complaint   Back Pain    History of Present Illness   Vanessa Jensen is a 32 y.o. female who presents with Back Pain    Interval history  06/07/18  Pain Location: Neck, Shoulder, Right, Upper, Back, Left, Buttocks  Pain: Stable  Sleep: Stable  Acupuncture Comments: Last Tx: good. CC: L LBP, R upper/mid back pain, neck tension. L LBP rated 5-6/10 (last tx 8/10), still has occasional feeling of being stuck, working on it at PT. R upper/mid back pain rated 3/10 (last tx 4/10), mostly along lower trap. Neck is tight from having increased stress over the weekend, had an anxiety attack, also some HAs. Self care includes PT, stretching, deep breathing.     Right mid back pain persists, temporary relief with treatment, 3/10  Left low back pain, persists, 5-6/10  Sore throat resolved    06/07/18  Right mid back pain, medial to scapula  Chronic left low back pain  Last treatment helped pain for few days  Seeing PT which is working on both areas  Has not obtained cane massager  Would like to continue TPIs today    05/31/18  Sore throat resolved - attributes to detoxing from supplements  Right shoulder and mid back pain, pain along medial scapula  Doing stretches, discussed cane massager  Sees PT  Requests refill on albuterol inhaler today  Requests refill on ibuprofen which she takes as needed  Stopped meloxicam since was not helpful  Discussed trigger point injections (TPIs) with associated risks/benefits, patient would like to receive TPIs today for mid back and shoulder strain      05/11/18  Mid back pain, worse on right  Low back pain, on left  Has been using boric acid and tea tree oil suppository for yeast infection    05/03/18  Nasal and sinus congestion better since taking augmentin States having pelvic pain, attributes to yeast infection, cannot take fluconazole, needs boric acid, in contact with gynecologist  Residual cough, decreased phlegm  Mid back pain, worse on right side, persists  Also left low back pain, radiating into left buttocks    04/26/18  Nasal and sinus congestion persists  Mid back pain, right sided    04/22/18  Right mid back pain  Sore throat a week ago, nasal congestion and sneezing, cough with variable white and yellow sputum  Sore throat better, but some residual throat discomfort  No fevers or chills  Was on plane and friends also came down with similar symptoms    04/14/18  Was in [place] visiting friends and family for 2 weeks, feels better after trip, has better perspective  Mid back pain, worse on right   Also pain in arms and hands    Initial intake 05/29/16  Fatigue, energy level low  Even with good sleep, 8h/night, less than 5/10  Context of insomnia for years, stopped trazadone  Uses THC, smoked, which helps  ???  Headaches since high school, saw neurologist at the time, but could not figure out the cause, diagnosed with chronic mild migraines, brain imaging negative  Since a month ago, headaches more severe, temporal, currently 4/10. Can be severe up to 8-9/10  Sharp pain, not so much throbbing  Occurring in context of neck pain  ???  Neck pain, assoc stiffness,  worse over past month  Had TPI through immunologist, which helped, best relief for a couple days  Headaches improved a bit after neck pain improved  ???  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 Asthma    ??? CIN III (cervical intraepithelial neoplasia III)     s/p LEEP 12/16   ??? Depression with anxiety     prior suicide attempt   ??? History of alcohol abuse     h/o alchohol withdrawls   ??? History of eating disorder    ??? History of pancreatitis 2001   ??? History of substance abuse (HCC/RAF)    ??? Insomnia    ??? Migraines    ??? Sjogren's syndrome (HCC/RAF)    ??? Urticaria      Patient Active Problem List    Diagnosis Date Noted   ??? Sjogren's syndrome (HCC/RAF) 10/08/2016     Outpatient Medications Prior to Visit   Medication Sig   ??? albuterol 90 mcg/act inhaler Inhale 2 puffs every six (6) hours as needed.   ??? beclomethasone 40 mcg/act inhaler Take 2 puffs by nebulization.   ??? cetirizine 5 mg tablet    ??? fluoxetine 20 mg capsule TAKE ONE CAOSULE BY MOUTH EVERY MORNING   ??? hydroxychloroquine (PLAQUENIL) 200 mg tablet Take 1 tablet (200 mg total) by mouth daily.   ??? HYDROXYCHLOROQUINE 200 mg tablet TAKE 1 TABLET BY MOUTH EVERY DAY   ??? hydroxyzine 25 mg tablet    ??? ibuprofen 600 mg tablet Take 1 tablet (600 mg total) by mouth every six (6) hours as needed.   ??? loratadine-pseudoephedrine 10-240 mg tablet Take 1 tablet by mouth daily.   ??? mirtazapine 15 mg tablet Take 15 mg by mouth.   ??? mirtazapine 15 mg tablet TAKE 1 TABLET BY MOUTH EVERY DAY AT NIGHT   ??? ondansetron 4 mg tablet Take 1 tablet (4 mg total) by mouth every six (6) hours as needed for Nausea.   ??? PATADAY 0.2 % ophthalmic solution    ??? trazodone 50 mg tablet TAKE 1 TO 3 TABLETS BY MOUTH AT BEDTIME AS NEEDED FOR INSOMNIA.     No facility-administered medications prior to visit.      Allergies     Allergies   Allergen Reactions   ??? Clindamycin    ??? Nitrofurantoin    ??? Tea      Green tea   ??? Fluconazole Hives   ??? Metronidazole Hives   ??? Sulfa Antibiotics Hives     Review of Systems   Const:  ENT:   Resp:   GI: Gyn:   Musculo:  Mid back pain, low back pain  Skin:    Neuro:    Psych:  stress  Physical Examination   BP 112/69  ~ Pulse 82  ~ Temp 36.9 ???C (98.5 ???F) (Temporal)  ~ Ht 5' 3'' (1.6 m)  ~ Wt 121 lb (54.9 kg)  ~  BMI 21.43 kg/m???   General: Well-appearing, no distress; no audible wheezing, no resp distress  Eyes: Eyelids normal. Conjunctiva normal.   ENMT: External ears and nose normal.   Neck: Neck symmetric.    Cardiovasc: No lower extremity edema.   Musculo: Tenderness to palpation as noted on annotated image.   Skin: Rashes none.  Psych: Mood and affect normal.  Trigger/Tender Point Identification: (see annotated image)        Laboratory studies / Diagnostics / Imaging   Obtained and reviewed old records from [facility].    Assessment / Plan   Diora Bellizzi is a 32 y.o. female with  1. Strain of latissimus dorsi muscle, sequela  Inject TendonSheath/Ligament BP   2. Muscle strain of left gluteal region, sequela  Inject TendonSheath/Ligament BP   3. Mid back pain  Myofascial Release Massage    Acupuncture   4. Chronic left-sided low back pain without sciatica  Myofascial Release Massage    Acupuncture   5. Myofascial pain          Plan  #Latissimus dorsi strain, sequela  -TPI today    #Gluteal strain, left, sequela  -TPI today    #Mid back pain  -MFR cupping today  -Acupuncture today  -cane massager   -continue stretches  -f/u PT    #Chronic low back pain, left-sided  -Follow up spine surgery as per patient  -f/u PT    #Myofascial pain  -Recommend self massage, stretching, and thermotherapy.  -ibuprofen as needed    #Asthma - stable  -albuterol inhaler  -establish PCP    Reviewed treatments, and treatment benefits, risks, alternatives with patient. Patients opts for today: MFR, acupuncture, TPI    Acupuncture Points:  (KD3, UB10, GB21, UB15, UB23; (L) GB41, L5 HTJJ, UB31, Iliocostalis Ashi, QL Ashi x2, SI Joint Ashi x2, Glute Ashi x2; (R) UB62, Lower Trap Ashi x3, Longissmius Ashi x2, Latissimus Dorsi Ashi, SI11, SI10, SI13, Rhomboid Ashi)   Acupuncture Treatment Duration: 20 Minutes  Myofascial Release Massage Duration: 8 Min (neck, R shoulder, R upper back, low back, L glutes)  Injection of : Patient Tolerated Procedure Well, Tendon Sheath with 0.18mL 1% lidocaine  Injection with:: 0.62mL of 1% lidocaine  Injection with:: 0.2 mL    Rhomboids, Levator scapulae (right) Latissimus dorsi (right)   Gluteal muscles (left)          Return in about 1 week (around 06/21/2018).     Patient was seen and examined with Barton Dubois, LAc.  I was present/available on site while procedure(s) was/were performed and agree with findings/assessment/plan we formulated together as documented above.    Corliss Marcus, MD, MS  06/14/2018 2:13 PM    Patient Instructions   F/u PT  Cane massager    Shoulder stretches  Albuterol, ibuprofen as directed

## 2018-06-14 NOTE — Patient Instructions
F/u PT  Cane massager    Shoulder stretches  Albuterol, ibuprofen as directed

## 2018-06-16 ENCOUNTER — Ambulatory Visit: Payer: BLUE CROSS/BLUE SHIELD | Attending: Rheumatology

## 2018-06-16 DIAGNOSIS — M3500 Sicca syndrome, unspecified: Secondary | ICD-10-CM

## 2018-06-16 LAB — Differential Automated: MONOCYTE PERCENT, AUTO: 10.5 (ref 1.80–6.90)

## 2018-06-16 LAB — CBC: HEMATOCRIT: 38.6 (ref 34.9–45.2)

## 2018-06-16 LAB — Sedimentation Rate, Erythrocyte: SEDIMENTATION RATE, ERYTHROCYTE: 7 mm/h (ref <=25)

## 2018-06-16 NOTE — Progress Notes
PATIENT: Vanessa Jensen  MRN: [id number]  DOB: Sep 10, 1985  DATE OF SERVICE: 06/16/2018  CHIEF COMPLAINT:   Chief Complaint   Patient presents with   ??? Sjogren's syndrome, with unspecified organ involvement   ??? Dizziness   ??? Eczema     Lips   ??? Panic Attack     last week   ??? Insomnia      HPI   Vanessa Jensen is a 32 y.o. female presents for the following:    06/16/18:  Has been sober x8 months. Doing a heavy metal detox program with holistic doctor in [place]. Doing licorice, dandelion tea, ''transform anger'' homeopathic herbal supplement, has cut out sugars, processed foods.     Feels very dry recently. Has not had a flare up in a long time. Took an ibuprofen recently.   Still doesn't sleep well.   Doing a holistic coaching course.   Getting unemployment approved.     12/28/17:  Bulging eye.  when gets stressed or tired. Feels very tired.   10 point ROS reviewed.    10/28/17:  Fatigued. Has been having difficulty sleeping. Grandmother died. Almost died drinking and went to detox [facility]. Was placed on a lot of psychiatric meds. Was placed on keppra, ativan, gabapentin, abilify.   Currently in AA and doing it.    Staying with friend and wife now. Was never placed on inpatient.   Had recent respiratory infection and was just given prednisone for respiratory infection.   cbd drops currently. Left foot collapsed arch and it is very painful.         09/08/17:  Joints have been aching. Had a burning redness from ankles to both knees with burning sensation.   Has been very stressed with significant life changes. Still with left wrist pain that is persistent.   Going to see specialist on Thursday. Mouth and eyes are very dry.   Integrative health coaching nutrition class online that is pretty mellow.   Recent pred taper helped a little bit.   Getting feet mris done soon.       05/06/17:  When walks on right foot feels neuropathic pain, has pain in lower back, pain in joints in hands, bilateral elbows. Feels very fatigued. Also father died last week and stress is really affecting her energy level as well. Has to push herself. At first no sleep and the last few nights was unable to get out of bed. Had recent viral infection.   10 point ROS reviewed.     03/04/17:  Saw Dr. 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 Was experiencing getting cold spells and if has chills. Gets to a point where can't breathe or talk and usually lasts about 30seconds and then resolve.   Now has been experiencing severe fatigue, dry eyes, and dry mouth. Has pain in extremities with a throb in the in distal extremities. Was taking green tea with matcha and has allergy to it.     10/08/16:   Recovered from flu and cold. Feeling a little bit better. Still with some fatigue.   10 point ROS otherwise unchanged.     08/27/16:  Has gotten the flu and a cold and has been under a lot of stress. Still with fatigue and joint pains in various areas. Still gets hives on and off. Back is uncomfortable. Lidocaine is helping with the muscle spasm.     At last visit:  Has been getting headaches since started up the prozac again. Still pretty fatigued. Coffee was making her feel bad so went to low acid coffee.     At last visit:   Got laid off while on depression leave. Feels exhausted all the time. Tried the plaquenil for about 3 weeks. The joint pains hit the knees hard sometimes. No prior injuries to knees. Biggest symptoms is the fatigue. Has been breaking out in hives. Gets blisters all over that are itchy and pop with itching. Took benadryl and it got better. Had them for about 2 months. She is not sure if there is a trigger for this.     At last visit:   Still with fatigue. Arthralgias. Feels a fog. Intermittent red rashes in sun.  Has sicca symptoms.       Initial HPI:   Started to get recurrent yeast infections with lip swelling with bleeding, cracking hives.   For past two months. Lethargic, hives on arms, headaches, dizzy spells; nausea, night sweats.   Saw immunologist and was tested for sjogren's and told has positive ab for this. Get's raynaud's.   Feels fatigued; has insomnia (x15 years); has dry eyes; Feeling very stiff lately.  Used to drink a lot of alcohol and now cannot tolerate it. Sicca symptoms     Had recent injuries in wrist (ulnar dislocation); right wrist tendon torn; left foot collapsed arch; right foot broke toe.    Has a lot of stress in life;         PMH   Insomnia  Asthma  Pancreatitis at 14;  HPV    PSxH   Had recent injuries in wrist (ulnar dislocation); right wrist tendon torn; left foot collapsed arch; right foot broke toe.    LEEP 12/17    ALL     Allergies   Allergen Reactions   ??? Clindamycin    ??? Nitrofurantoin    ??? Tea      Green tea   ??? Fluconazole Hives   ??? Metronidazole Hives   ??? Sulfa Antibiotics Hives     MEDS     Medications that the patient states to be currently taking   Medication Sig   ??? albuterol 90 mcg/act inhaler Inhale 2 puffs every six (6) hours as needed.   ??? clonazePAM 0.5 mg tablet TAKE 1 TABLET BY MOUTH TWICE A DAY AS NEEDED   ??? ibuprofen 600 mg tablet Take 1 tablet (600 mg total) by mouth every six (6) hours as needed.   ??? Licorice, Glycyrrhiza glabra, (LICORICE PO) Take by mouth.   ??? LITHIUM PO Take 5 mg by mouth two (2) times daily.   ??? Misc Natural Products (DANDELION ROOT PO) Take by mouth.   ??? UNABLE TO FIND Citrigen .   ??? UNABLE TO FIND Heavy metal detox spray .       [facility]     Family History   Problem Relation Age of Onset   ??? Crohn's disease Sister         half sister     Social History   Substance Use Topics   ??? Smoking status: Current Every Day Smoker   ??? Smokeless tobacco: Never Used   ??? Alcohol use Not on file       ROS   (Check if Normal, or note + findings):     [x]  reviewed questionnaire on 06/16/2018    []  Not Obtainable:   []  Constit  []  Skin    []   Eyes  []  CV   []  MS []  ENT  []  GI  []  Heme/Lymph     []  Resp  []  GU  []  Neuro    []  Imm/All   []  Endo  []  Psych     PHYSICAL EXAM   Check if Normal, or note + findings  Vit BP 110/75  ~ Pulse 62  ~ Temp 36.6 ???C (97.9 ???F)  ~ Ht 5' 3'' (1.6 m)  ~ Wt 120 lb (54.4 kg)  ~ SpO2 99%  ~ BMI 21.26 kg/m???    Gen [x]  NAD  GI []  Abd  []  Rect []  HSM   Eyes [x]  Conj/Lids []  Pupils  []  Masses []  Guard/Rebnd   ENT []  Ears/otosc    []  Oroph   []  Nasal Muc  []  Hearing GU:Fem  AV:WUJW []  Ext   []  Vag      []  Pen  []  Scro    []  Cerv []  Uter/Ad []  Deferred  []  Prost []  Deferred   Neck []  Inspect/Palp   []  Thyroid Neuro [x]  A&O []  CN2-12   Breast []  Inspect   []  Palpation  []  DTR []  Motor/Sen   Resp [x]  Effort [x]  Ascult []  Percuss MSK [x]  Gait   [x]  ROM  []  Tone  []  Bal []  Insp/palp  []  Back   CV [x]  Auscultate []  Carotids       [x]  Edema Puls: []  Pedal []  Fem  Skin [x]  Inspect  Healed scar on lateral part of wrist. Ventral wrist and forearm scar.  []  Palp   Lymph [x]  Neck  []  Axillary  []   Femoral Psych [x]  Insight/judg  []  Affect []  Cog      28 joints examined   ~~~~~~~~~~~~~~~~~~~~~~~~~~~~~~~~~~~~~~~~~~~~~~~~~~~~~~~~  RIGHT  Tender Joint Count:   LEFT   TMJ []   ~ []  TMJ   Shoulder []  ~ []  Shoulder   Elbow []  ~ []  Elbow   Wrist []  ~ [x]  Wrist   CMC1 []  ~ []  CMC1  ~  []  MCP5 []  MCP4 []  MCP3 []  MCP2 []  MCP1 ~ []  MCP1 []  MCP2 []  MCP3 []  MCP4 []  MCP5  []  PIP5 []  PIP4 []  PIP3 []  PIP2 []  IP1 ~ []  IP1 []  PIP2 []  PIP3 []  PIP4 []  PIP5   []  DIP5 []  DIP4 []  DIP3 []  DIP2~  ~  []  DIP2 []  DIP3 []  DIP4 []  DIP5                 []  cervical spine                 []  thoracic spine                 []  lumbar spine      Sacroiliac  []  ~       []  Sacroiliac                Hip  []  ~       []  Hip                  Knee  []  ~       []  Knee        Ankle    []  ~       []  Ankle      []  MTP5 []  MTP4 []  MTP3 []  MTP2 []  MTP1 ~ []  MTP1 []  MTP2 []  MTP3 []  MTP4 []  MCP5  []  Toe5 []  Toe4 []  Toe3 []  Toe2 []  Toe1 ~ []  Toe1 []   Toe2 []  Toe3 []  Toe4 []  Toe5 ~~~~~~~~~~~~~~~~~~~~~~~~~~~~~~~~~~~~~~~~~~~~~~~~~~~~~~~~   RIGHT  Swollen Joint Count:   LEFT   TMJ []   ~ []  TMJ   Shoulder []  ~ []  Shoulder   Elbow []  ~ []  Elbow   Wrist []  ~ [x]  Wrist   CMC1 []  ~ []  CMC1  ~  []  MCP5 []  MCP4 []  MCP3 []  MCP2 []  MCP1 ~ []  MCP1 []  MCP2 []  MCP3 []  MCP4 []  MCP5  []  PIP5 []  PIP4 []  PIP3 []  PIP2 []  IP1 ~ []  IP1 []  PIP2 []  PIP3 []  PIP4 []  PIP5   []  DIP5 []  DIP4 []  DIP3 []  DIP2~  ~  []  DIP2 []  DIP3 []  DIP4 []  DIP5              Sacroiliac  []  ~       []  Sacroiliac                                          Knee  []  ~       []  Knee       Ankle    []  ~       []  Ankle  []  MTP5 []  MTP4 []  MTP3 []  MTP2 []  MTP1 ~ []  MTP1 []  MTP2 []  MTP3 []  MTP4 []  MCP5  []  Toe5 []  Toe4 []  Toe3 []  Toe2 []  Toe1 ~ []  Toe1 []  Toe2 []  Toe3 []  Toe4 []  Toe5     Recent Labs and Test Results:     Lab Results   Component Value Date    CRP 0.6 12/28/2017    CRP <0.3 09/08/2017    SRWEST 15 12/28/2017    SRWEST 4 09/08/2017     Lab Results   Component Value Date    WBC 6.54 12/28/2017    HGB 12.1 12/28/2017    HCT 37.4 12/28/2017    PLT 276 12/28/2017    NEUTABS 3.20 12/28/2017    LYMPHABS 2.49 12/28/2017     Lab Results   Component Value Date    GLUCOSE 93 12/28/2017    CREAT 0.61 12/28/2017    CALCIUM 9.6 12/28/2017    TOTPRO 7.1 12/28/2017    ALBUMIN 4.2 12/28/2017    AST 31 12/28/2017    ALT 41 12/28/2017    ALKPHOS 56 12/28/2017     Lab Results   Component Value Date    CKTOT 122 12/28/2017     Lab Results   Component Value Date    C3 108 12/28/2017    C3 94 09/08/2017    C4 23 12/28/2017    C4 21 09/08/2017    DSDNAAB <=200 12/28/2017    DSDNAAB <=200 09/08/2017    NDNAABIFA <1:10 12/28/2017     No results found for: CANCA, PANCA, PROT3AB, MPOAB  No results found for: PROTCLUR, BLDUR, LEUKESTUR, RBCSUR, WBCSUR  Lab Results   Component Value Date    VITD25OH 40 12/28/2017    VITD25OH 48 08/27/2016     Outside labs reviewed  ANCA-neg  ANA IFA-neg  SSA-strong pos  ssb-neg      A&P # sjogren's ab +, arthralgia, raynaud's, fatigue, low grade fevers and itching with intermittent hives.   -serologies-negative except for positive SSA.   - conservative management of dry eyes and dry mouth discussed  -sun avoidance  Labs today to follow up.   --referral to [facility] at last visit.  Stopped plaquenil and feels that it did not help her.     --feels that body rejects meds.   OTC products for the dryness     Still would benefit from stress reduction and decreased physical stressors.     Following up with hand specialist    Given ongoing work up for hand pain and foot discomfort as well as current flare and stressors think extension off work would be appropriate.        RTC 2 months    The above recommendation were discussed with the patient.  The patient has all questions answered satisfactorily and is in agreement with this recommended plan of care.  Author:  Lutricia Horsfall 06/16/2018 9:36 AM

## 2018-06-17 LAB — C3: C3: 79 mg/dL (ref 76–165)

## 2018-06-17 LAB — C4: C4: 19 mg/dL (ref 14–46)

## 2018-06-17 LAB — Comprehensive Metabolic Panel
CHLORIDE: 103 mmol/L (ref 96–106)
CHLORIDE: 103 mmol/L (ref 96–106)

## 2018-06-17 LAB — C-Reactive Protein: C-REACTIVE PROTEIN: <0.3 mg/dL (ref <0.8)

## 2018-06-17 LAB — TSH with reflex FT4, FT3: TSH: 1.5 u[IU]/mL (ref 0.3–4.7)

## 2018-06-17 LAB — Antinuclear Ab: ANTINUCLEAR AB: <1:40 {titer}

## 2018-06-17 LAB — PROTEIN/CREATININE RATIO, URINE: PROTEIN,URINE: <4 mg/dL

## 2018-06-18 LAB — dsDNA Ab EIA: DSDNA AB EIA: <=200 [IU]/mL (ref <=200)

## 2018-06-18 LAB — SSA/SSB Abs: SSA ANTIBODY: 68 U — ABNORMAL HIGH (ref <20)

## 2018-06-18 LAB — dsDNA Ab IFA: NDNA (CRITHIDIA) AB IFA: <1:10 {titer}

## 2018-06-21 ENCOUNTER — Ambulatory Visit: Payer: BLUE CROSS/BLUE SHIELD

## 2018-06-21 DIAGNOSIS — M7918 Myalgia, other site: Secondary | ICD-10-CM

## 2018-06-21 DIAGNOSIS — G8929 Other chronic pain: Secondary | ICD-10-CM

## 2018-06-21 DIAGNOSIS — S29012D Strain of muscle and tendon of back wall of thorax, subsequent encounter: Secondary | ICD-10-CM

## 2018-06-21 DIAGNOSIS — M549 Dorsalgia, unspecified: Secondary | ICD-10-CM

## 2018-06-21 DIAGNOSIS — S76012S Strain of muscle, fascia and tendon of left hip, sequela: Secondary | ICD-10-CM

## 2018-06-21 DIAGNOSIS — M545 Low back pain: Secondary | ICD-10-CM

## 2018-06-21 NOTE — Progress Notes
[facility] Progress Note   Patient: Vanessa Jensen MRN: [id number] Date of Birth: Mar 06, 1986 Date of Service: 06/21/2018   Referring Provider: No ref. provider found Primary Care Provider: Pcp, No, MD  Chief Complaint   Back Pain and Dehydration    History of Present Illness   Vanessa Jensen is a 32 y.o. female who presents with Back Pain and Dehydration    Interval history  06/21/18  Pain Location: Neck, Left, Lower, Back, Right, Shoulder  Pain: Better  Sleep: Stable  Acupuncture Comments: Checked in 10 minutes late, understands late policy, ACU/TPI only today. Last Tx: good, pain relief. CC: L LBP, R upper/mid back pain, sjogren's flare. L LBP slightly better, rated 6/10, still gets pinchy feeling. R upper/mid back pain slightly better, rated 4/10. Neck tension the same. Having sjogren's flare, feeling very dry, fatigue, HA, burning pain in feet. Trying to stay hydrated. Self care includes PT, stretching, deep breathing, hydrating.     Left low back pain, 6/10  Right upper and mid back pain, 4/10  Headaches, dry sensation, associated fatigue, burning pain in feet    06/07/18  Right mid back pain persists, temporary relief with treatment, 3/10  Left low back pain, persists, 5-6/10  Sore throat resolved    06/07/18  Right mid back pain, medial to scapula  Chronic left low back pain  Last treatment helped pain for few days  Seeing PT which is working on both areas  Has not obtained cane massager  Would like to continue TPIs today    05/31/18  Sore throat resolved - attributes to detoxing from supplements  Right shoulder and mid back pain, pain along medial scapula  Doing stretches, discussed cane massager  Sees PT  Requests refill on albuterol inhaler today  Requests refill on ibuprofen which she takes as needed  Stopped meloxicam since was not helpful  Discussed trigger point injections (TPIs) with associated risks/benefits, patient would like to receive TPIs today for mid back and shoulder strain 05/11/18  Mid back pain, worse on right  Low back pain, on left  Has been using boric acid and tea tree oil suppository for yeast infection    05/03/18  Nasal and sinus congestion better since taking augmentin  States having pelvic pain, attributes to yeast infection, cannot take fluconazole, needs boric acid, in contact with gynecologist  Residual cough, decreased phlegm  Mid back pain, worse on right side, persists  Also left low back pain, radiating into left buttocks    04/26/18  Nasal and sinus congestion persists  Mid back pain, right sided    04/22/18  Right mid back pain  Sore throat a week ago, nasal congestion and sneezing, cough with variable white and yellow sputum  Sore throat better, but some residual throat discomfort  No fevers or chills  Was on plane and friends also came down with similar symptoms    04/14/18  Was in [place] visiting friends and family for 2 weeks, feels better after trip, has better perspective  Mid back pain, worse on right   Also pain in arms and hands    Initial intake 05/29/16  Fatigue, energy level low  Even with good sleep, 8h/night, less than 5/10  Context of insomnia for years, stopped trazadone  Uses THC, smoked, which helps  ???  Headaches since high school, saw neurologist at the time, but could not figure out the cause, diagnosed with chronic mild migraines, brain imaging negative  Since a month ago, headaches more severe,  temporal, currently 4/10. Can be severe up to 8-9/10  Sharp pain, not so much throbbing  Occurring in context of neck pain  ???  Neck pain, assoc stiffness, worse over past month  Had TPI through immunologist, which helped, best relief for a couple days  Headaches improved a bit after neck pain improved  ???  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 Asthma    ??? CIN III (cervical intraepithelial neoplasia III)     s/p LEEP 12/16   ??? Depression with anxiety     prior suicide attempt   ??? History of alcohol abuse     h/o alchohol withdrawls   ??? History of eating disorder    ??? History of pancreatitis 2001   ??? History of substance abuse (HCC/RAF)    ??? Insomnia    ??? Migraines    ??? Sjogren's syndrome (HCC/RAF)    ??? Urticaria      Patient Active Problem List    Diagnosis Date Noted   ??? Sjogren's syndrome (HCC/RAF) 10/08/2016     Outpatient Medications Prior to Visit   Medication Sig   ??? albuterol 90 mcg/act inhaler Inhale 2 puffs every six (6) hours as needed.   ??? ARIPiprazole 5 mg tablet TAKE 1 TABLET BY MOUTH EVERY DAY   ??? beclomethasone 40 mcg/act inhaler Take 2 puffs by nebulization.   ??? cetirizine 5 mg tablet    ??? clonazePAM 0.5 mg tablet TAKE 1 TABLET BY MOUTH TWICE A DAY AS NEEDED   ??? fluoxetine 20 mg capsule TAKE ONE CAOSULE BY MOUTH EVERY MORNING   ??? hydroxyzine 25 mg tablet    ??? ibuprofen 600 mg tablet Take 1 tablet (600 mg total) by mouth every six (6) hours as needed.   ??? Licorice, Glycyrrhiza glabra, (LICORICE PO) Take by mouth.   ??? LITHIUM PO Take 5 mg by mouth two (2) times daily.   ??? loratadine-pseudoephedrine 10-240 mg tablet Take 1 tablet by mouth daily.   ??? mirtazapine 15 mg tablet Take 15 mg by mouth.   ??? mirtazapine 15 mg tablet TAKE 1 TABLET BY MOUTH EVERY DAY AT NIGHT   ??? Misc Natural Products (DANDELION ROOT PO) Take by mouth.   ??? ondansetron 4 mg tablet Take 1 tablet (4 mg total) by mouth every six (6) hours as needed for Nausea.   ??? PATADAY 0.2 % ophthalmic solution ??? trazodone 50 mg tablet TAKE 1 TO 3 TABLETS BY MOUTH AT BEDTIME AS NEEDED FOR INSOMNIA.   ??? UNABLE TO FIND Citrigen .   ??? UNABLE TO FIND Heavy metal detox spray .   ??? hydroxychloroquine (PLAQUENIL) 200 mg tablet Take 1 tablet (200 mg total) by mouth daily. (Patient not taking: Reported on 06/21/2018.)   ??? HYDROXYCHLOROQUINE 200 mg tablet TAKE 1 TABLET BY MOUTH EVERY DAY (Patient not taking: Reported on 06/21/2018)     No facility-administered medications prior to visit.  Allergies     Allergies   Allergen Reactions   ??? Clindamycin    ??? Nitrofurantoin    ??? Tea      Green tea   ??? Fluconazole Hives   ??? Metronidazole Hives   ??? Sulfa Antibiotics Hives     Review of Systems   Const:  ENT:   Resp:   GI:    Gyn:   Musculo:  Mid back pain, low back pain  Skin:    Neuro:    Psych:  stress  Physical Examination   BP 105/64  ~ Pulse 76  ~ Temp 36.7 ???C (98.1 ???F) (Temporal)   General: Well-appearing, no distress; no audible wheezing, no resp distress  Eyes: Eyelids normal. Conjunctiva normal.   ENMT: External ears and nose normal.   Neck: Neck symmetric.    Cardiovasc: No lower extremity edema.   Musculo: Tenderness to palpation as noted on annotated image.   Skin: Rashes none.  Psych: Mood and affect normal.  Trigger/Tender Point Identification: (see annotated image)      Laboratory studies / Diagnostics / Imaging   Obtained and reviewed old records from [facility].    Assessment / Plan   Vanessa Jensen is a 32 y.o. female with  1. Strain of latissimus dorsi muscle, subsequent encounter  Inject TendonSheath/Ligament BP   2. Muscle strain of left gluteal region, sequela  Inject TendonSheath/Ligament BP   3. Mid back pain  Acupuncture   4. Chronic left-sided low back pain without sciatica  Acupuncture   5. Myofascial pain          Plan  #Latissimus dorsi strain, subsequent  -TPI today    #Gluteal strain, left, sequela  -TPI today    #Mid back pain  -MFR cupping today  -Acupuncture today  -cane massager   -continue stretches -f/u PT    #Chronic low back pain, left-sided  -Follow up spine surgery as per patient  -f/u PT    #Myofascial pain  -Recommend self massage, stretching, and thermotherapy.  -ibuprofen as needed    #Fatigue  -Recommend american ginseng    #Asthma - stable  -albuterol inhaler  -Recommend establish care with PCP    Reviewed treatments, and treatment benefits, risks, alternatives with patient. Patients opts for today: acupuncture, TPI        Acupuncture Treatment Duration: 20 Minutes(KD6, SP6, KD9, KD10, UB22, UB23, L5 HTJJ, QL Ashi, UB10, GB21; (L) Iliocostalis Ashi, QL Ashi, SI Joint Ashi, Glute Ashi, UB31; (R) SI11, SI10, Latissimus Dorsi Ashi, Lower Trap Ashi, Rhomboid Ashi x2, SI13; Electro 5/100Hz  (L) QL x2;(R)Rhomboid x2)                        Return in about 1 week (around 06/28/2018).     Patient was seen and examined with Barton Dubois, LAc.  I was present/available on site while procedure(s) was/were performed and agree with findings/assessment/plan we formulated together as documented above.    Corliss Marcus, MD, MS  06/21/2018 9:10 AM    Patient Instructions   F/u PT  Cane massager    Shoulder stretches  Albuterol, ibuprofen as directed    American Ginseng Tea (made in [place])  Recommended to help increase energy for select people.    Brew in thermos or cup of hot water.   Drink 2-3 times a day. Avoid drinking in the late afternoon or evening to avoid interfering with sleep. Use new tea bag each day.    Criss [place]  [facility]

## 2018-06-21 NOTE — Patient Instructions
F/u PT  Cane massager    Shoulder stretches  Albuterol, ibuprofen as directed    American Ginseng Tea (made in [place])  Recommended to help increase energy for select people.    Brew in thermos or cup of hot water.   Drink 2-3 times a day. Avoid drinking in the late afternoon or evening to avoid interfering with sleep. Use new tea bag each day.    Criss Alv[place] [employer]

## 2018-06-30 ENCOUNTER — Ambulatory Visit: Payer: BLUE CROSS/BLUE SHIELD

## 2018-06-30 DIAGNOSIS — M7918 Myalgia, other site: Secondary | ICD-10-CM

## 2018-06-30 DIAGNOSIS — S29012S Strain of muscle and tendon of back wall of thorax, sequela: Secondary | ICD-10-CM

## 2018-06-30 DIAGNOSIS — M549 Dorsalgia, unspecified: Secondary | ICD-10-CM

## 2018-06-30 DIAGNOSIS — S161XXS Strain of muscle, fascia and tendon at neck level, sequela: Secondary | ICD-10-CM

## 2018-06-30 DIAGNOSIS — S39012S Strain of muscle, fascia and tendon of lower back, sequela: Secondary | ICD-10-CM

## 2018-06-30 NOTE — Patient Instructions
F/u PT  Cane massager    Shoulder stretches  Albuterol, ibuprofen as directed    American Ginseng Tea (made in [place])  Recommended to help increase energy for select people.    Brew in thermos or cup of hot water.   Drink 2-3 times a day. Avoid drinking in the late afternoon or evening to avoid interfering with sleep. Use new tea bag each day.    [facility] [employer]

## 2018-07-01 NOTE — Progress Notes
[facility] Progress Note   Patient: Vanessa Jensen MRN: [id number] Date of Birth: 1986-01-08 Date of Service: 06/30/2018   Referring Provider: No ref. provider found Primary Care Provider: Pcp, No, MD  Chief Complaint   Back Pain and Neck Pain    History of Present Illness   Vanessa Jensen is a 32 y.o. female who presents with Back Pain and Neck Pain    Interval history  06/30/18  Pain Location: Right, Upper, Left, Lower, Back  Pain: Stable  Sleep: Stable  Acupuncture Comments: Last Tx: good. CC: L LBP, R upper/mid back pain, L neck & shoulder pain. Sjogren's flare resolved. L LBP rated 5/10 (last tx 6/10). R upper/mid back pain rated 5/10 (last tx 4/10). L neck and shoulder has spasm ongoing for a couple days, aggravated by moving residence yesterday, also had tough PT workout. Started period a couple days ago (LMP 06/28/18), noticing cramping and PMS bad ever since in her 30s. Sleep: wakes after 5 hours and can't go back to sleep. Energy: tired d/t not enough sleep. Self care includes PT, stretching, hydrating.     Left low back pain 5/10  Right mid back pain 5/10  Bilateral neck shoulder pain, associated spasms, worse on left, context of moving yesterday    06/21/18  Left low back pain, 6/10  Right upper and mid back pain, 4/10  Headaches, dry sensation, associated fatigue, burning pain in feet    06/07/18  Right mid back pain persists, temporary relief with treatment, 3/10  Left low back pain, persists, 5-6/10  Sore throat resolved    06/07/18  Right mid back pain, medial to scapula  Chronic left low back pain  Last treatment helped pain for few days  Seeing PT which is working on both areas  Has not obtained cane massager  Would like to continue TPIs today    05/31/18  Sore throat resolved - attributes to detoxing from supplements  Right shoulder and mid back pain, pain along medial scapula  Doing stretches, discussed cane massager  Sees PT  Requests refill on albuterol inhaler today Requests refill on ibuprofen which she takes as needed  Stopped meloxicam since was not helpful  Discussed trigger point injections (TPIs) with associated risks/benefits, patient would like to receive TPIs today for mid back and shoulder strain      05/11/18  Mid back pain, worse on right  Low back pain, on left  Has been using boric acid and tea tree oil suppository for yeast infection    05/03/18  Nasal and sinus congestion better since taking augmentin  States having pelvic pain, attributes to yeast infection, cannot take fluconazole, needs boric acid, in contact with gynecologist  Residual cough, decreased phlegm  Mid back pain, worse on right side, persists  Also left low back pain, radiating into left buttocks    04/26/18  Nasal and sinus congestion persists  Mid back pain, right sided    04/22/18  Right mid back pain  Sore throat a week ago, nasal congestion and sneezing, cough with variable white and yellow sputum  Sore throat better, but some residual throat discomfort  No fevers or chills  Was on plane and friends also came down with similar symptoms    04/14/18  Was in [place] visiting friends and family for 2 weeks, feels better after trip, has better perspective  Mid back pain, worse on right   Also pain in arms and hands    Initial intake 05/29/16  Fatigue, energy  level low  Even with good sleep, 8h/night, less than 5/10  Context of insomnia for years, stopped trazadone  Uses THC, smoked, which helps  ???  Headaches since high school, saw neurologist at the time, but could not figure out the cause, diagnosed with chronic mild migraines, brain imaging negative  Since a month ago, headaches more severe, temporal, currently 4/10. Can be severe up to 8-9/10  Sharp pain, not so much throbbing  Occurring in context of neck pain  ???  Neck pain, assoc stiffness, worse over past month  Had TPI through immunologist, which helped, best relief for a couple days  Headaches improved a bit after neck pain improved  ??? 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 Asthma    ??? CIN III (cervical intraepithelial neoplasia III)     s/p LEEP 12/16   ??? Depression with anxiety     prior suicide attempt   ??? History of alcohol abuse     h/o alchohol withdrawls   ??? History of eating disorder    ??? History of pancreatitis 2001   ??? History of substance abuse (HCC/RAF)    ??? Insomnia    ??? Migraines    ??? Sjogren's syndrome (HCC/RAF)    ??? Urticaria      Patient Active Problem List    Diagnosis Date Noted   ??? Sjogren's syndrome (HCC/RAF) 10/08/2016     Outpatient Medications Prior to Visit   Medication Sig   ??? albuterol 90 mcg/act inhaler Inhale 2 puffs every six (6) hours as needed.   ??? ARIPiprazole 5 mg tablet TAKE 1 TABLET BY MOUTH EVERY DAY   ??? beclomethasone 40 mcg/act inhaler Take 2 puffs by nebulization.   ??? cetirizine 5 mg tablet    ??? clonazePAM 0.5 mg tablet TAKE 1 TABLET BY MOUTH TWICE A DAY AS NEEDED   ??? fluoxetine 20 mg capsule TAKE ONE CAOSULE BY MOUTH EVERY MORNING   ??? hydroxychloroquine (PLAQUENIL) 200 mg tablet Take 1 tablet (200 mg total) by mouth daily.   ??? HYDROXYCHLOROQUINE 200 mg tablet TAKE 1 TABLET BY MOUTH EVERY DAY   ??? hydroxyzine 25 mg tablet    ??? ibuprofen 600 mg tablet Take 1 tablet (600 mg total) by mouth every six (6) hours as needed.   ??? Licorice, Glycyrrhiza glabra, (LICORICE PO) Take by mouth.   ??? LITHIUM PO Take 5 mg by mouth two (2) times daily. ??? loratadine-pseudoephedrine 10-240 mg tablet Take 1 tablet by mouth daily.   ??? mirtazapine 15 mg tablet Take 15 mg by mouth.   ??? mirtazapine 15 mg tablet TAKE 1 TABLET BY MOUTH EVERY DAY AT NIGHT   ??? Misc Natural Products (DANDELION ROOT PO) Take by mouth.   ??? ondansetron 4 mg tablet Take 1 tablet (4 mg total) by mouth every six (6) hours as needed for Nausea.   ??? PATADAY 0.2 % ophthalmic solution    ???  trazodone 50 mg tablet TAKE 1 TO 3 TABLETS BY MOUTH AT BEDTIME AS NEEDED FOR INSOMNIA.   ??? UNABLE TO FIND Citrigen .   ??? UNABLE TO FIND Heavy metal detox spray .     No facility-administered medications prior to visit.      Allergies     Allergies   Allergen Reactions   ??? Clindamycin    ??? Nitrofurantoin    ??? Tea      Green tea   ??? Fluconazole Hives   ??? Metronidazole Hives   ??? Sulfa Antibiotics Hives     Review of Systems   Const:  ENT:   Resp:   GI:    Gyn:   Musculo:  Mid back pain, low back pain, neck shoulder pain  Skin:    Neuro:    Psych:  stress  Physical Examination   BP 115/76  ~ Pulse 88  ~ Temp 36.1 ???C (97 ???F)  ~ Ht 5' 3'' (1.6 m)  ~ Wt 116 lb (52.6 kg)  ~ SpO2 99%  ~ BMI 20.55 kg/m???   General: Well-appearing, no distress; no audible wheezing, no resp distress  Eyes: Eyelids normal. Conjunctiva normal.   ENMT: External ears and nose normal.   Neck: Neck symmetric.    Cardiovasc: No lower extremity edema.   Musculo: Tenderness to palpation as noted on annotated image.   Skin: Rashes none.  Psych: Mood and affect normal.  Trigger/Tender Point Identification: (see annotated image)      Laboratory studies / Diagnostics / Imaging   Obtained and reviewed old records from [facility].    Assessment / Plan   Mindy Gali is a 33 y.o. female with  1. Strain of neck muscle, sequela  Inject TendonSheath/Ligament BP   2. Strain of rhomboid muscle, sequela  Inject TendonSheath/Ligament BP   3. Back strain, sequela     4. Mid back pain  Myofascial Release Massage    Acupuncture 5. Myofascial pain  Myofascial Release Massage        Plan  #Neck strain, sequela  -TPI today    #Rhomboid strain, sequela  -TPI today    #Back strain, sequela  -TPI today    #Mid back pain  -MFR cupping today  -Acupuncture today  -cane massager   -continue stretches  -f/u PT    #Chronic low back pain, left-sided  -Follow up spine surgery as per patient  -f/u PT    #Myofascial pain  -Recommend self massage, stretching, and thermotherapy.  -ibuprofen as needed    #Fatigue  -Recommend american ginseng    #Asthma - stable  -albuterol inhaler  -Recommend establish care with PCP     Reviewed treatments, and treatment benefits, risks, alternatives with patient. Patients opts for today: acupuncture, TPI, MFR    Acupuncture Points: (KD3, UB10, GB21, UB31; (L) UB62, Iliocostalis Ashi, QL Ashi x2, SI Joint Ashi x2, Glute Ashi, L5 HTJJ; (R) Paraspinal Ashi x3, Rhomboid Ashi x2, Lower Trap Ashi, SI11, SI10, Latissimus Dorsi Ashi x2; Electro 5/100Hz  (R) Paraspinal Ashi x2, (L) QL Ashi x2)   Acupuncture Treatment Duration: 20 Minutes  Myofascial Release Massage Duration: 8 Min(ST. CUPPING: neck, shoulders, upper/mid/lower back)  Injection of : Tendon Sheath with 0.80mL 1% lidocaine, Patient Tolerated Procedure Well  Injection with:: 0.60mL of 1% lidocaine  Injection with:: 0.2 mL  Trapezius Rhomboids(right)                Return in about 1 week (around 07/07/2018).  Patient was seen and examined with Barton Dubois, LAc.  I was present/available on site while procedure(s) was/were performed and agree with findings/assessment/plan we formulated together as documented above.    Corliss Marcus, MD, MS  06/30/2018 4:36 PM    Patient Instructions   F/u PT  Cane massager    Shoulder stretches  Albuterol, ibuprofen as directed    American Ginseng Tea (made in [place])  Recommended to help increase energy for select people.    Brew in thermos or cup of hot water.   Drink 2-3 times a day. Avoid drinking in the late afternoon or evening to avoid interfering with sleep. Use new tea bag each day.    [facility]

## 2018-07-06 ENCOUNTER — Ambulatory Visit: Payer: BLUE CROSS/BLUE SHIELD

## 2018-07-06 DIAGNOSIS — M7918 Myalgia, other site: Secondary | ICD-10-CM

## 2018-07-06 DIAGNOSIS — S29012S Strain of muscle and tendon of back wall of thorax, sequela: Secondary | ICD-10-CM

## 2018-07-06 DIAGNOSIS — J069 Acute upper respiratory infection, unspecified: Secondary | ICD-10-CM

## 2018-07-06 DIAGNOSIS — M549 Dorsalgia, unspecified: Secondary | ICD-10-CM

## 2018-07-06 DIAGNOSIS — S39012S Strain of muscle, fascia and tendon of lower back, sequela: Secondary | ICD-10-CM

## 2018-07-06 NOTE — Progress Notes
[facility] Progress Note   Patient: Vanessa Jensen MRN: [id number] Date of Birth: [birth year] Date of Service: 07/06/2018   Referring Provider: No ref. provider found Primary Care Provider: Pcp, No, MD  Chief Complaint   Neck Pain    History of Present Illness   Vanessa Jensen is a 32 y.o. female who presents with Neck Pain    Interval history  07/06/18  Pain Location: Right, Upper, Left, Lower, Back  Pain: Stable  Sleep: Worse  Acupuncture Comments: Last Tx: good. CC: cold/flu symptoms, L LBP, R upper/mid back pain. Started feeling cold/flu symptoms yesterday, splitting whole head HA, chills and body aches, sinus congestion, fatigue, took ibuprofen. Also has UTI. L LBP rated 6/10 (last tx 5/10), better after PT yesterday. R upper/mid back pain better, rated 4/10 (last tx 5/10), not as much pain, describes it as mostly a pressure sensation medial to R scapula. Sleep: poor lately, goes to bed around 10pm, able to fall asleep, wakes after 5 hours and can't go back to sleep. Stress: busy stress, but no anxiety lately. Self care includes PT, stretching, hydrating. Recommended continuing fluids, resting, chicken soup.      Left low back pain 6/10  Right mid back pain 4/10  Left neck pain better after acupuncture  Feels she has UTI - self treating with amoxicillin  Notes some chills and myalgias, dry throat  Denies fever, sore throat, SOB, nausea  Staying hydrated  Concerned about yeast infection with abx - planning to get boric acid, taking probiotics  Seeing holistic doctor (outside)  Requests TPIs for right mid back strain, medial to scapula    06/21/18  Left low back pain, 6/10  Right upper and mid back pain, 4/10  Headaches, dry sensation, associated fatigue, burning pain in feet    06/07/18  Right mid back pain persists, temporary relief with treatment, 3/10  Left low back pain, persists, 5-6/10  Sore throat resolved    06/07/18  Right mid back pain, medial to scapula  Chronic left low back pain Last treatment helped pain for few days  Seeing PT which is working on both areas  Has not obtained cane massager  Would like to continue TPIs today    05/31/18  Sore throat resolved - attributes to detoxing from supplements  Right shoulder and mid back pain, pain along medial scapula  Doing stretches, discussed cane massager  Sees PT  Requests refill on albuterol inhaler today  Requests refill on ibuprofen which she takes as needed  Stopped meloxicam since was not helpful  Discussed trigger point injections (TPIs) with associated risks/benefits, patient would like to receive TPIs today for mid back and shoulder strain      05/11/18  Mid back pain, worse on right  Low back pain, on left  Has been using boric acid and tea tree oil suppository for yeast infection    05/03/18  Nasal and sinus congestion better since taking augmentin  States having pelvic pain, attributes to yeast infection, cannot take fluconazole, needs boric acid, in contact with gynecologist  Residual cough, decreased phlegm  Mid back pain, worse on right side, persists  Also left low back pain, radiating into left buttocks    04/26/18  Nasal and sinus congestion persists  Mid back pain, right sided    04/22/18  Right mid back pain  Sore throat a week ago, nasal congestion and sneezing, cough with variable white and yellow sputum  Sore throat better, but some residual throat discomfort  No  fevers or chills  Was on plane and friends also came down with similar symptoms    04/14/18  Was in [place] visiting friends and family for 2 weeks, feels better after trip, has better perspective  Mid back pain, worse on right   Also pain in arms and hands    Initial intake 05/29/16  Fatigue, energy level low  Even with good sleep, 8h/night, less than 5/10  Context of insomnia for years, stopped trazadone  Uses THC, smoked, which helps  ???  Headaches since high school, saw neurologist at the time, but could not figure out the cause, diagnosed with chronic mild migraines, brain imaging negative  Since a month ago, headaches more severe, temporal, currently 4/10. Can be severe up to 8-9/10  Sharp pain, not so much throbbing  Occurring in context of neck pain  ???  Neck pain, assoc stiffness, worse over past month  Had TPI through immunologist, which helped, best relief for a couple days  Headaches improved a bit after neck pain improved  ???  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 Asthma    ??? CIN III (cervical intraepithelial neoplasia III)     s/p LEEP 12/16   ??? Depression with anxiety     prior suicide attempt   ??? History of alcohol abuse     h/o alchohol withdrawls   ??? History of eating disorder    ??? History of pancreatitis 2001   ??? History of substance abuse (HCC/RAF)    ??? Insomnia    ??? Migraines    ??? Sjogren's syndrome (HCC/RAF)    ??? Urticaria      Patient Active Problem List    Diagnosis Date Noted   ??? Sjogren's syndrome (HCC/RAF) 10/08/2016     Outpatient Medications Prior to Visit   Medication Sig   ??? albuterol 90 mcg/act inhaler Inhale 2 puffs every six (6) hours as needed.   ??? ARIPiprazole 5 mg tablet TAKE 1 TABLET BY MOUTH EVERY DAY   ??? beclomethasone 40 mcg/act inhaler Take 2 puffs by nebulization.   ??? cetirizine 5 mg tablet    ??? clonazePAM 0.5 mg tablet TAKE 1 TABLET BY MOUTH TWICE A DAY AS NEEDED   ??? fluoxetine 20 mg capsule TAKE ONE CAOSULE BY MOUTH EVERY MORNING ??? hydroxychloroquine (PLAQUENIL) 200 mg tablet Take 1 tablet (200 mg total) by mouth daily.   ??? HYDROXYCHLOROQUINE 200 mg tablet TAKE 1 TABLET BY MOUTH EVERY DAY   ??? hydroxyzine 25 mg tablet    ??? ibuprofen 600 mg tablet Take 1 tablet (600 mg total) by mouth every six (6) hours as needed.   ??? Licorice, Glycyrrhiza glabra, (LICORICE PO) Take by mouth.   ??? LITHIUM PO Take 5 mg by mouth two (2) times daily.   ??? loratadine-pseudoephedrine 10-240 mg tablet Take 1 tablet by mouth daily.   ??? mirtazapine 15 mg tablet Take 15 mg by mouth.   ???  mirtazapine 15 mg tablet TAKE 1 TABLET BY MOUTH EVERY DAY AT NIGHT   ??? Misc Natural Products (DANDELION ROOT PO) Take by mouth.   ??? ondansetron 4 mg tablet Take 1 tablet (4 mg total) by mouth every six (6) hours as needed for Nausea.   ??? PATADAY 0.2 % ophthalmic solution    ??? trazodone 50 mg tablet TAKE 1 TO 3 TABLETS BY MOUTH AT BEDTIME AS NEEDED FOR INSOMNIA.   ??? UNABLE TO FIND Citrigen .   ??? UNABLE TO FIND Heavy metal detox spray .     No facility-administered medications prior to visit.      Allergies     Allergies   Allergen Reactions   ??? Clindamycin    ??? Nitrofurantoin    ??? Tea      Green tea   ??? Fluconazole Hives   ??? Metronidazole Hives   ??? Sulfa Antibiotics Hives     Review of Systems   Const:  ENT:   Resp:   GI:    Gyn:   Musculo:  Mid back pain, low back pain, neck shoulder pain  Skin:    Neuro:    Psych:  stress  Physical Examination   BP 124/86  ~ Pulse 60  ~ Temp 36.4 ???C (97.6 ???F) (Tympanic)   General: Well-appearing, no distress; no audible wheezing, no resp distress  Eyes: Eyelids normal. Conjunctiva normal.   ENMT: External ears and nose normal.   Neck: Neck symmetric.    Cardiovasc: No lower extremity edema.   Musculo: Tenderness to palpation as noted on annotated image.   Skin: Rashes none.  Psych: Mood and affect normal.  Trigger/Tender Point Identification: (see annotated image)      Laboratory studies / Diagnostics / Imaging Obtained and reviewed old records from [facility].    Assessment / Plan   Vanessa Jensen is a 32 y.o. female with  1. Strain of muscle and tendon of back wall of thorax, sequela  Myofascial Release Massage    Acupuncture   2. Back strain, sequela  Myofascial Release Massage    Acupuncture   3. Mid back pain  Myofascial Release Massage    Acupuncture   4. Myofascial pain  Myofascial Release Massage    Acupuncture   5. Myalgia  Myofascial Release Massage    Acupuncture        Plan  #Neck strain, sequela  -TPI today    #Thoracic strain, sequela  -TPI today    #Mid back pain  -MFR cupping today  -Acupuncture today  -cane massager   -continue stretches  -f/u PT    #Chronic low back pain, left-sided  -Follow up spine surgery as per patient  -f/u PT    #Myofascial pain  -Recommend self massage, stretching, and thermotherapy.  -ibuprofen as needed    #Fatigue  -Recommend american ginseng    #Asthma - stable  -albuterol inhaler  -Recommend establish care with PCP     #UTI  -complete course of abx as directed  -cranberry tablets    Reviewed treatments, and treatment benefits, risks, alternatives with patient. Patients opts for today: acupuncture, TPI, MFR    Acupuncture Points: (LI4, LU7, GB20, UB12, GB21; (L) UB62, QL Ashi, L5 HTJJ; (R) SI3, Paraspinal Ashi x2; Electro 5/100Hz  (R) Paraspinal Ashi x2, (L) QL Ashi<->L5 HTJJ)   Acupuncture Treatment Duration: 20 Minutes  Myofascial Release Massage Duration: 8 Min(ST. CUPPING: neck, shoulders, R upper back, L lower back)  Injection of : Patient Tolerated Procedure  Well, Tendon Sheath with 0.77mL 1% lidocaine       Rhomboids(right) Latissimus dorsi(right)              Return in about 1 week (around 07/13/2018).     Patient was seen and examined with Barton Dubois, LAc.  I was present/available on site while procedure(s) was/were performed and agree with findings/assessment/plan we formulated together as documented above.    Twala Collings B. Leane Call, MD  07/06/2018 10:42 AM Patient Instructions   Trial of cranberry tablets    F/u PT  Cane massager  Shoulder stretches  Albuterol, ibuprofen as directed

## 2018-07-06 NOTE — Patient Instructions
Trial of cranberry tablets    F/u PT  Cane massager  Shoulder stretches  Albuterol, ibuprofen as directed

## 2018-07-14 ENCOUNTER — Ambulatory Visit: Payer: BLUE CROSS/BLUE SHIELD

## 2018-07-14 DIAGNOSIS — G8929 Other chronic pain: Secondary | ICD-10-CM

## 2018-07-14 DIAGNOSIS — S29012S Strain of muscle and tendon of back wall of thorax, sequela: Secondary | ICD-10-CM

## 2018-07-14 DIAGNOSIS — M542 Cervicalgia: Secondary | ICD-10-CM

## 2018-07-14 DIAGNOSIS — S39012S Strain of muscle, fascia and tendon of lower back, sequela: Secondary | ICD-10-CM

## 2018-07-14 DIAGNOSIS — M549 Dorsalgia, unspecified: Secondary | ICD-10-CM

## 2018-07-14 DIAGNOSIS — S46812S Strain of other muscles, fascia and tendons at shoulder and upper arm level, left arm, sequela: Secondary | ICD-10-CM

## 2018-07-14 DIAGNOSIS — M545 Low back pain: Secondary | ICD-10-CM

## 2018-07-14 NOTE — Patient Instructions
Trial of cranberry tablets    F/u PT  Cane massager  Shoulder stretches  Albuterol, ibuprofen as directed

## 2018-07-14 NOTE — Progress Notes
[facility] Progress Note   Patient: Vanessa Jensen MRN: [id number] Date of Birth: 1985/09/28 Date of Service: 07/14/2018   Referring Provider: No ref. provider found Primary Care Provider: Pcp, No, MD  Chief Complaint   Neck Pain    History of Present Illness   Vanessa Jensen is a 32 y.o. female who presents with Neck Pain    Interval history  07/14/18  Pain Location: Shoulder, Right, Upper, Left, Lower, Back  Pain: Stable  Sleep: Stable  Acupuncture Comments: Last Tx: achy after. CC: L LBP, R upper/mid back pain, L neck and shoulder tension. Still getting over cold symptoms, feeling achy and severe fatigue. L LBP worse, rated 8/10 (last tx 6/10), attributes to not getting to PT last week. R upper/mid back pain still bothersome, rated 5-6/10 (last tx 4/10). L neck and shoulder tight lately, causing increased HA frequency in temporal/frontal region. Also mentions B/L calf ''swelling''. Sleep: poor. Self care includes stretching, chair massage.     Left neck pain  Associated radiating pain and headache  Right mid back pain  Left back pain worse, 8/10, seeing PT  Sleep poor, unable to get restful night's sleep    07/06/18  Left low back pain 6/10  Right mid back pain 4/10  Left neck pain better after acupuncture  Feels she has UTI - self treating with amoxicillin  Notes some chills and myalgias, dry throat  Denies fever, sore throat, SOB, nausea  Staying hydrated  Concerned about yeast infection with abx - planning to get boric acid, taking probiotics  Seeing holistic doctor (outside)  Requests TPIs for right mid back strain, medial to scapula    06/21/18  Left low back pain, 6/10  Right upper and mid back pain, 4/10  Headaches, dry sensation, associated fatigue, burning pain in feet    06/07/18  Right mid back pain persists, temporary relief with treatment, 3/10  Left low back pain, persists, 5-6/10  Sore throat resolved    06/07/18  Right mid back pain, medial to scapula  Chronic left low back pain Last treatment helped pain for few days  Seeing PT which is working on both areas  Has not obtained cane massager  Would like to continue TPIs today    05/31/18  Sore throat resolved - attributes to detoxing from supplements  Right shoulder and mid back pain, pain along medial scapula  Doing stretches, discussed cane massager  Sees PT  Requests refill on albuterol inhaler today  Requests refill on ibuprofen which she takes as needed  Stopped meloxicam since was not helpful  Discussed trigger point injections (TPIs) with associated risks/benefits, patient would like to receive TPIs today for mid back and shoulder strain      05/11/18  Mid back pain, worse on right  Low back pain, on left  Has been using boric acid and tea tree oil suppository for yeast infection    05/03/18  Nasal and sinus congestion better since taking augmentin  States having pelvic pain, attributes to yeast infection, cannot take fluconazole, needs boric acid, in contact with gynecologist  Residual cough, decreased phlegm  Mid back pain, worse on right side, persists  Also left low back pain, radiating into left buttocks    04/26/18  Nasal and sinus congestion persists  Mid back pain, right sided    04/22/18  Right mid back pain  Sore throat a week ago, nasal congestion and sneezing, cough with variable white and yellow sputum  Sore throat better, but some  residual throat discomfort  No fevers or chills  Was on plane and friends also came down with similar symptoms    04/14/18  Was in [place] visiting friends and family for 2 weeks, feels better after trip, has better perspective  Mid back pain, worse on right   Also pain in arms and hands    Initial intake 05/29/16  Fatigue, energy level low  Even with good sleep, 8h/night, less than 5/10  Context of insomnia for years, stopped trazadone  Uses THC, smoked, which helps  ???  Headaches since high school, saw neurologist at the time, but could not figure out the cause, diagnosed with chronic mild migraines, brain imaging negative  Since a month ago, headaches more severe, temporal, currently 4/10. Can be severe up to 8-9/10  Sharp pain, not so much throbbing  Occurring in context of neck pain  ???  Neck pain, assoc stiffness, worse over past month  Had TPI through immunologist, which helped, best relief for a couple days  Headaches improved a bit after neck pain improved  ???  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 Asthma    ??? CIN III (cervical intraepithelial neoplasia III)     s/p LEEP 12/16   ??? Depression with anxiety     prior suicide attempt   ??? History of alcohol abuse     h/o alchohol withdrawls   ??? History of eating disorder    ??? History of pancreatitis 2001   ??? History of substance abuse (HCC/RAF)    ??? Insomnia    ??? Migraines    ??? Sjogren's syndrome (HCC/RAF)    ??? Urticaria      Patient Active Problem List    Diagnosis Date Noted   ??? Sjogren's syndrome (HCC/RAF) 10/08/2016     Outpatient Medications Prior to Visit   Medication Sig   ??? albuterol 90 mcg/act inhaler Inhale 2 puffs every six (6) hours as needed.   ??? ARIPiprazole 5 mg tablet TAKE 1 TABLET BY MOUTH EVERY DAY   ??? beclomethasone 40 mcg/act inhaler Take 2 puffs by nebulization.   ??? cetirizine 5 mg tablet    ??? clonazePAM 0.5 mg tablet TAKE 1 TABLET BY MOUTH TWICE A DAY AS NEEDED   ??? fluoxetine 20 mg capsule TAKE ONE CAOSULE BY MOUTH EVERY MORNING ??? hydroxychloroquine (PLAQUENIL) 200 mg tablet Take 1 tablet (200 mg total) by mouth daily.   ??? HYDROXYCHLOROQUINE 200 mg tablet TAKE 1 TABLET BY MOUTH EVERY DAY   ??? hydroxyzine 25 mg tablet    ??? ibuprofen 600 mg tablet Take 1 tablet (600 mg total) by mouth every six (6) hours as needed.   ??? Licorice, Glycyrrhiza glabra, (LICORICE PO) Take by mouth.   ??? LITHIUM PO Take 5 mg by mouth two (2) times daily.   ??? loratadine-pseudoephedrine 10-240 mg tablet Take 1 tablet by mouth daily.   ??? mirtazapine 15 mg tablet  Take 15 mg by mouth.   ??? mirtazapine 15 mg tablet TAKE 1 TABLET BY MOUTH EVERY DAY AT NIGHT   ??? Misc Natural Products (DANDELION ROOT PO) Take by mouth.   ??? ondansetron 4 mg tablet Take 1 tablet (4 mg total) by mouth every six (6) hours as needed for Nausea.   ??? PATADAY 0.2 % ophthalmic solution    ??? trazodone 50 mg tablet TAKE 1 TO 3 TABLETS BY MOUTH AT BEDTIME AS NEEDED FOR INSOMNIA.   ??? UNABLE TO FIND Citrigen .   ??? UNABLE TO FIND Heavy metal detox spray .     No facility-administered medications prior to visit.      Allergies     Allergies   Allergen Reactions   ??? Clindamycin    ??? Nitrofurantoin    ??? Tea      Green tea   ??? Fluconazole Hives   ??? Metronidazole Hives   ??? Sulfa Antibiotics Hives     Review of Systems   Const:  ENT:   Resp:   GI:    Gyn:   Musculo:  Neck pain, mid back pain, low back pain  Skin:    Neuro:  headache  Psych:  stress  Physical Examination   BP 94/56  ~ Pulse 51  ~ Temp 36.4 ???C (97.6 ???F) (Temporal)  ~ Ht 5' 3'' (1.6 m)  ~ Wt 116 lb (52.6 kg)  ~ BMI 20.55 kg/m???   General: Well-appearing, no distress; no audible wheezing, no resp distress  Eyes: Eyelids normal. Conjunctiva normal.   ENMT: External ears and nose normal.   Neck: Neck symmetric.    Cardiovasc: No lower extremity edema.   Musculo: Tenderness to palpation as noted on annotated image.   Skin: Rashes none.  Psych: Mood and affect normal.  Trigger/Tender Point Identification: (see annotated image) Laboratory studies / Diagnostics / Imaging   Obtained and reviewed old records from [facility].    Assessment / Plan   Caylie Sandquist is a 32 y.o. female with  1. Strain of left trapezius muscle, sequela  Inject TendonSheath/Ligament BP   2. Strain of rhomboid muscle, sequela  Inject TendonSheath/Ligament BP   3. Back strain, sequela  Inject TendonSheath/Ligament BP   4. Neck pain on left side  Myofascial Release Massage    Acupuncture   5. Mid back pain  Myofascial Release Massage    Acupuncture   6. Chronic left-sided low back pain without sciatica  Myofascial Release Massage    Acupuncture        Plan  #Left trapezius strain, sequela  -TPI today    #Rhomboid strain, sequela  -TPI today    #Back strain, sequela  -TPI today    #Left neck pain  -MFR, acupuncture today  -f/u PT    #Mid back pain  -MFR cupping today  -Acupuncture today  -cane massager   -continue stretches  -f/u PT    #Chronic low back pain, left-sided  -Follow up spine surgery as per patient  -f/u PT    #Myofascial pain  -Recommend self massage, stretching, and thermotherapy.  -ibuprofen as needed    #Sleep disturbance  -Recommend melatonin 5-6mg  qhs  -Sleep hygiene briefly discussed    Reviewed treatments, and treatment benefits, risks, alternatives with patient. Patients opts for today: acupuncture, TPI, MFR    Acupuncture Points: (SP6, SP9, UB57, UB15, UB18, UB20, UB23, L5 HTJJ, GB21; DU20; (L) SI12, Iliocostalis Ashi, QL Ashi x2; (R) SI11, Lower Trap Ashi, Paraspinal Ashi  x2, Latissimus Dorsi Ashi; Electro 5/100Hz  (R) Parapsinal Ashi x2, (L) QL Ashi<->Iliocostalis Ashi)   Acupuncture Treatment Duration: 20 Minutes  Myofascial Release Massage Duration: 8 Min(ST. CUPPING: neck, shoulders, upper/mid/lower back)  Injection of : Tendon Sheath with 0.82mL 1% lidocaine, Patient Tolerated Procedure Well  Injection with:: 0.39mL of 1% lidocaine  Injection with:: 0.2 mL  Splenius capitis, Trapezius, Scalene muscles(left) Rhomboids(right) Iliocostalis(left) Gluteal muscles(left)          Return in about 1 week (around 07/21/2018).     Patient was seen and examined with Barton Dubois, LAc.  I was present/available on site while procedure(s) was/were performed and agree with findings/assessment/plan we formulated together as documented above.    Corliss Marcus, MD, MS  07/14/2018 10:13 AM    Patient Instructions   Trial of cranberry tablets    F/u PT  Cane massager  Shoulder stretches  Albuterol, ibuprofen as directed

## 2018-07-20 ENCOUNTER — Ambulatory Visit: Payer: BLUE CROSS/BLUE SHIELD

## 2018-07-22 ENCOUNTER — Ambulatory Visit: Payer: BLUE CROSS/BLUE SHIELD

## 2018-07-22 DIAGNOSIS — S29012S Strain of muscle and tendon of back wall of thorax, sequela: Secondary | ICD-10-CM

## 2018-07-22 DIAGNOSIS — M549 Dorsalgia, unspecified: Secondary | ICD-10-CM

## 2018-07-22 DIAGNOSIS — M545 Low back pain: Secondary | ICD-10-CM

## 2018-07-22 DIAGNOSIS — G8929 Other chronic pain: Secondary | ICD-10-CM

## 2018-07-22 DIAGNOSIS — S161XXD Strain of muscle, fascia and tendon at neck level, subsequent encounter: Secondary | ICD-10-CM

## 2018-07-22 MED ORDER — ALBUTEROL SULFATE HFA 108 (90 BASE) MCG/ACT IN AERS
2 | Freq: Four times a day (QID) | RESPIRATORY_TRACT | 0 refills | Status: AC | PRN
Start: 2018-07-22 — End: 2018-09-04

## 2018-07-22 NOTE — Progress Notes
[facility] Progress Note   Patient: Vanessa Jensen MRN: [id number] Date of Birth: [birth year] Date of Service: 07/22/2018   Referring Provider: No ref. provider found Primary Care Provider: Pcp, No, MD  Chief Complaint   Back Pain and Neck Pain    History of Present Illness   Vanessa Jensen is a 32 y.o. female who presents with Back Pain and Neck Pain    Interval history  07/22/18  Pain Location: Neck, Right, Upper, Left, Lower, Back  Pain: Stable  Sleep: Stable  Acupuncture Comments: Last Tx: good. CC: L LBP, R upper/mid back pain, L neck & shoulder tension. No longer has cold symptoms. L LBP rated 6-7/10 (last tx 8/10). R upper/mid back pain stable, rated 4/10 (last tx 5-6/10). L neck and shoulder tension better, still slightly tight. Stress: elevated with family issues. Self care includes stretching.     Left low back pain, 6-7/10  Right upper and mid back pain, 4/10  Seeing PT    07/14/18  Left neck pain  Associated radiating pain and headache  Right mid back pain  Left back pain worse, 8/10, seeing PT  Sleep poor, unable to get restful night's sleep    07/06/18  Left low back pain 6/10  Right mid back pain 4/10  Left neck pain better after acupuncture  Feels she has UTI - self treating with amoxicillin  Notes some chills and myalgias, dry throat  Denies fever, sore throat, SOB, nausea  Staying hydrated  Concerned about yeast infection with abx - planning to get boric acid, taking probiotics  Seeing holistic doctor (outside)  Requests TPIs for right mid back strain, medial to scapula    06/21/18  Left low back pain, 6/10  Right upper and mid back pain, 4/10  Headaches, dry sensation, associated fatigue, burning pain in feet    06/07/18  Right mid back pain persists, temporary relief with treatment, 3/10  Left low back pain, persists, 5-6/10  Sore throat resolved    06/07/18  Right mid back pain, medial to scapula  Chronic left low back pain  Last treatment helped pain for few days Seeing PT which is working on both areas  Has not obtained cane massager  Would like to continue TPIs today    05/31/18  Sore throat resolved - attributes to detoxing from supplements  Right shoulder and mid back pain, pain along medial scapula  Doing stretches, discussed cane massager  Sees PT  Requests refill on albuterol inhaler today  Requests refill on ibuprofen which she takes as needed  Stopped meloxicam since was not helpful  Discussed trigger point injections (TPIs) with associated risks/benefits, patient would like to receive TPIs today for mid back and shoulder strain    05/11/18  Mid back pain, worse on right  Low back pain, on left  Has been using boric acid and tea tree oil suppository for yeast infection    05/03/18  Nasal and sinus congestion better since taking augmentin  States having pelvic pain, attributes to yeast infection, cannot take fluconazole, needs boric acid, in contact with gynecologist  Residual cough, decreased phlegm  Mid back pain, worse on right side, persists  Also left low back pain, radiating into left buttocks    04/26/18  Nasal and sinus congestion persists  Mid back pain, right sided    04/22/18  Right mid back pain  Sore throat a week ago, nasal congestion and sneezing, cough with variable white and yellow sputum  Sore throat better, but  some residual throat discomfort  No fevers or chills  Was on plane and friends also came down with similar symptoms    04/14/18  Was in [place] visiting friends and family for 2 weeks, feels better after trip, has better perspective  Mid back pain, worse on right   Also pain in arms and hands    Initial intake 05/29/16  Fatigue, energy level low  Even with good sleep, 8h/night, less than 5/10  Context of insomnia for years, stopped trazadone  Uses THC, smoked, which helps  ???  Headaches since high school, saw neurologist at the time, but could not figure out the cause, diagnosed with chronic mild migraines, brain imaging negative Since a month ago, headaches more severe, temporal, currently 4/10. Can be severe up to 8-9/10  Sharp pain, not so much throbbing  Occurring in context of neck pain  ???  Neck pain, assoc stiffness, worse over past month  Had TPI through immunologist, which helped, best relief for a couple days  Headaches improved a bit after neck pain improved  ???  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 Asthma    ??? CIN III (cervical intraepithelial neoplasia III)     s/p LEEP 12/16   ??? Depression with anxiety     prior suicide attempt   ??? History of alcohol abuse     h/o alchohol withdrawls   ??? History of eating disorder    ??? History of pancreatitis 2001   ??? History of substance abuse (HCC/RAF)    ??? Insomnia    ??? Migraines    ??? Sjogren's syndrome (HCC/RAF)    ??? Urticaria      Patient Active Problem List    Diagnosis Date Noted   ??? Sjogren's syndrome (HCC/RAF) 10/08/2016     Outpatient Medications Prior to Visit   Medication Sig   ??? albuterol 90 mcg/act inhaler Inhale 2 puffs every six (6) hours as needed.   ??? ARIPiprazole 5 mg tablet TAKE 1 TABLET BY MOUTH EVERY DAY   ??? beclomethasone 40 mcg/act inhaler Take 2 puffs by nebulization.   ??? cetirizine 5 mg tablet    ??? clonazePAM 0.5 mg tablet TAKE 1 TABLET BY MOUTH TWICE A DAY AS NEEDED   ??? fluoxetine 20 mg capsule TAKE ONE CAOSULE BY MOUTH EVERY MORNING   ??? hydroxychloroquine (PLAQUENIL) 200 mg tablet Take 1 tablet (200 mg total) by mouth daily. ??? HYDROXYCHLOROQUINE 200 mg tablet TAKE 1 TABLET BY MOUTH EVERY DAY   ??? hydroxyzine 25 mg tablet    ??? ibuprofen 600 mg tablet Take 1 tablet (600 mg total) by mouth every six (6) hours as needed.   ??? Licorice, Glycyrrhiza glabra, (LICORICE PO) Take by mouth.   ??? LITHIUM PO Take 5 mg by mouth two (2) times daily.   ??? loratadine-pseudoephedrine 10-240 mg tablet Take 1 tablet by mouth daily.   ??? mirtazapine 15 mg tablet  Take 15 mg by mouth.   ??? mirtazapine 15 mg tablet TAKE 1 TABLET BY MOUTH EVERY DAY AT NIGHT   ??? Misc Natural Products (DANDELION ROOT PO) Take by mouth.   ??? ondansetron 4 mg tablet Take 1 tablet (4 mg total) by mouth every six (6) hours as needed for Nausea.   ??? PATADAY 0.2 % ophthalmic solution    ??? trazodone 50 mg tablet TAKE 1 TO 3 TABLETS BY MOUTH AT BEDTIME AS NEEDED FOR INSOMNIA.   ??? UNABLE TO FIND Citrigen .   ??? UNABLE TO FIND Heavy metal detox spray .     No facility-administered medications prior to visit.      Allergies     Allergies   Allergen Reactions   ??? Clindamycin    ??? Nitrofurantoin    ??? Tea      Green tea   ??? Fluconazole Hives   ??? Metronidazole Hives   ??? Sulfa Antibiotics Hives     Review of Systems   Const:  ENT:   Resp:   GI:    Gyn:   Musculo:  Neck pain, mid back pain, low back pain  Skin:    Neuro:  headache  Psych: stress  Physical Examination   BP 99/61  ~ Pulse 57  ~ Temp 36.8 ???C (98.3 ???F) (Temporal)   General: Well-appearing, no distress; no audible wheezing, no resp distress  Eyes: Eyelids normal. Conjunctiva normal.   ENMT: External ears and nose normal.   Neck: Neck symmetric.    Cardiovasc: No lower extremity edema.   Musculo: Tenderness to palpation as noted on annotated image.   Skin: Rashes none.  Psych: Mood and affect normal.  Trigger/Tender Point Identification: (see annotated image)      Laboratory studies / Diagnostics / Imaging   Obtained and reviewed old records from [facility].    Assessment / Plan   Waver Dibiasio is a 32 y.o. female with 1. Strain of rhomboid muscle, sequela  Inject TendonSheath/Ligament BP   2. Strain of neck muscle, subsequent encounter  Inject TendonSheath/Ligament BP   3. Chronic left-sided low back pain without sciatica  Myofascial Release Massage    Acupuncture   4. Mid back pain  Myofascial Release Massage    Acupuncture        Plan  #Rhomboid strain, sequela  -TPI today    #Neck strain, subsequent  -TPI today    #Low back pain, left, chronic  -Acupuncture, MFR today  -Follow up PT    #Mid back pain  -MFR cupping today  -Acupuncture today  -cane massager   -continue stretches  -Follow up PT    #Myofascial pain  -Recommend self massage, stretching, and thermotherapy.  -ibuprofen as needed    #Sleep disturbance  -Recommend melatonin 5-6mg  qhs  -Sleep hygiene briefly discussed    Reviewed treatments, and treatment benefits, risks, alternatives with patient. Patients opts for today: acupuncture, TPI, MFR    Acupuncture Points: (SP6, SP9, C5 HTJJ, GB21, UB18, UB23, L5 HTJJ, UB31; (L) Iliocostalis Ashi, QL Ashi x2, SI Joint Ashi, L4 HTJJ; (R) SI11, SI13, Paraspinal Ashi x3, Lower Trap Ashi; Electro 5/100Hz  (R) Paraspinal Ashi x2, (L) QL Ashi x2)   Acupuncture Treatment Duration: 20 Minutes  Myofascial Release Massage Duration: 8 Min(ST. CUPPING: neck, shoulders, upper/mid/lower back)  Injection of : Tendon Sheath with 0.49mL 1% lidocaine, Patient Tolerated Procedure Well  Injection with:: 0.77mL of 1% lidocaine  Injection with:: 0.2 mL  Trapezius, Splenius capitis Rhomboids, Levator scapulae  Longissimus(left) Gluteal muscles(left)          Return in about 1 week (around 07/29/2018).     Patient was seen and examined with Barton Dubois, LAc.  I was present/available on site while procedure(s) was/were performed and agree with findings/assessment/plan we formulated together as documented above.    Corliss Marcus, MD, MS  07/22/2018 5:33 PM    Patient Instructions   Trial of cranberry tablets    F/u PT  Cane massager  Shoulder stretches Albuterol, ibuprofen as directed

## 2018-07-22 NOTE — Patient Instructions
Trial of cranberry tablets    F/u PT  Cane massager  Shoulder stretches  Albuterol, ibuprofen as directed

## 2018-07-27 ENCOUNTER — Ambulatory Visit: Payer: BLUE CROSS/BLUE SHIELD

## 2018-07-27 DIAGNOSIS — M545 Low back pain: Secondary | ICD-10-CM

## 2018-07-27 DIAGNOSIS — S161XXD Strain of muscle, fascia and tendon at neck level, subsequent encounter: Secondary | ICD-10-CM

## 2018-07-27 DIAGNOSIS — S29012S Strain of muscle and tendon of back wall of thorax, sequela: Secondary | ICD-10-CM

## 2018-07-27 DIAGNOSIS — M7918 Myalgia, other site: Secondary | ICD-10-CM

## 2018-07-27 DIAGNOSIS — J309 Allergic rhinitis, unspecified: Secondary | ICD-10-CM

## 2018-07-27 DIAGNOSIS — G8929 Other chronic pain: Secondary | ICD-10-CM

## 2018-07-27 DIAGNOSIS — M549 Dorsalgia, unspecified: Secondary | ICD-10-CM

## 2018-07-27 MED ORDER — CETIRIZINE HCL 5 MG PO TABS
5 mg | ORAL_TABLET | Freq: Every day | ORAL | 1 refills | Status: AC | PRN
Start: 2018-07-27 — End: 2018-09-29

## 2018-07-27 NOTE — Patient Instructions
F/u ortho for facet joint injection  Heat to affected areas    F/u PT  Cane massager  Shoulder stretches  Albuterol, ibuprofen as directed

## 2018-07-27 NOTE — Progress Notes
[facility] Progress Note   Patient: Vanessa Jensen MRN: [id number] Date of Birth: [birth year] Date of Service: 07/27/2018   Referring Provider: No ref. provider found Primary Care Provider: Pcp, No, MD  Chief Complaint   Back Pain and Neck Pain    History of Present Illness   Vanessa Jensen is a 32 y.o. female who presents with Back Pain and Neck Pain    Interval history  07/27/18  Pain Location: Neck, Right, Shoulder, Left, Lower, Back  Pain: Stable  Sleep: Stable  Acupuncture Comments: Last Tx: good. CC: L LBP, R upper/mid back pain, neck tension. Slept well last night, got 6 hours so her pain is a little less. L LBP rated 6/10 (last tx 6-7/10), baclofen helps, plans to get facet injection from spine doctor. R upper/mid back pain rated 3/10 (last tx 4/10). Neck tension increased lately. Self care includes yoga.     Left low back pain stable 6/10  Right mid back pain stable 3/10  Left > right neck pain  Seeing outside ortho - planning to have facet joint injection  Seeing holistic doctor (outside)    Indication: Myofascial neck, shoulder, mid back pain   Consent: The risks (infection, bleeding, pain) and benefits of tendon sheath injections were explained to the patient.  The patient verbalized understanding and consented to tendon sheath injections.  Sterilization: The target area of the tendon sheath points was sterilized using an alcohol pad.  Procedure: Using a 23 or 25 gauge 1 inch needle, 0.2 cc of lidocaine was injected in a sterile fashion into each of the tendon sheath points.     There was no significant blood loss.  The patient tolerated the procedure well.      06/21/18  Left low back pain, 6/10  Right upper and mid back pain, 4/10  Headaches, dry sensation, associated fatigue, burning pain in feet    06/07/18  Right mid back pain persists, temporary relief with treatment, 3/10  Left low back pain, persists, 5-6/10  Sore throat resolved    06/07/18  Right mid back pain, medial to scapula Chronic left low back pain  Last treatment helped pain for few days  Seeing PT which is working on both areas  Has not obtained cane massager  Would like to continue TPIs today    05/31/18  Sore throat resolved - attributes to detoxing from supplements  Right shoulder and mid back pain, pain along medial scapula  Doing stretches, discussed cane massager  Sees PT  Requests refill on albuterol inhaler today  Requests refill on ibuprofen which she takes as needed  Stopped meloxicam since was not helpful  Discussed trigger point injections (TPIs) with associated risks/benefits, patient would like to receive TPIs today for mid back and shoulder strain      05/11/18  Mid back pain, worse on right  Low back pain, on left  Has been using boric acid and tea tree oil suppository for yeast infection    05/03/18  Nasal and sinus congestion better since taking augmentin  States having pelvic pain, attributes to yeast infection, cannot take fluconazole, needs boric acid, in contact with gynecologist  Residual cough, decreased phlegm  Mid back pain, worse on right side, persists  Also left low back pain, radiating into left buttocks    04/26/18  Nasal and sinus congestion persists  Mid back pain, right sided    04/22/18  Right mid back pain  Sore throat a week ago, nasal congestion and sneezing, cough  with variable white and yellow sputum  Sore throat better, but some residual throat discomfort  No fevers or chills  Was on plane and friends also came down with similar symptoms    04/14/18  Was in [place] visiting friends and family for 2 weeks, feels better after trip, has better perspective  Mid back pain, worse on right   Also pain in arms and hands    Initial intake 05/29/16  Fatigue, energy level low  Even with good sleep, 8h/night, less than 5/10  Context of insomnia for years, stopped trazadone  Uses THC, smoked, which helps  ???  Headaches since high school, saw neurologist at the time, but could not figure out the cause, diagnosed with chronic mild migraines, brain imaging negative  Since a month ago, headaches more severe, temporal, currently 4/10. Can be severe up to 8-9/10  Sharp pain, not so much throbbing  Occurring in context of neck pain  ???  Neck pain, assoc stiffness, worse over past month  Had TPI through immunologist, which helped, best relief for a couple days  Headaches improved a bit after neck pain improved  ???  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 Asthma    ??? CIN III (cervical intraepithelial neoplasia III)     s/p LEEP 12/16   ??? Depression with anxiety     prior suicide attempt   ??? History of alcohol abuse     h/o alchohol withdrawls   ??? History of eating disorder    ??? History of pancreatitis 2001   ??? History of substance abuse (HCC/RAF)    ??? Insomnia    ??? Migraines    ??? Sjogren's syndrome (HCC/RAF)    ??? Urticaria      Patient Active Problem List    Diagnosis Date Noted   ??? Sjogren's syndrome (HCC/RAF) 10/08/2016     Outpatient Medications Prior to Visit   Medication Sig   ??? albuterol 90 mcg/act inhaler Inhale 2 puffs every six (6) hours as needed.   ??? ARIPiprazole 5 mg tablet TAKE 1 TABLET BY MOUTH EVERY DAY   ??? beclomethasone 40 mcg/act inhaler Take 2 puffs by nebulization.   ??? clonazePAM 0.5 mg tablet TAKE 1 TABLET BY MOUTH TWICE A DAY AS NEEDED   ??? fluoxetine 20 mg capsule TAKE ONE CAOSULE BY MOUTH EVERY MORNING ??? hydroxychloroquine (PLAQUENIL) 200 mg tablet Take 1 tablet (200 mg total) by mouth daily.   ??? HYDROXYCHLOROQUINE 200 mg tablet TAKE 1 TABLET BY MOUTH EVERY DAY   ??? hydroxyzine 25 mg tablet    ??? ibuprofen 600 mg tablet Take 1 tablet (600 mg total) by mouth every six (6) hours as needed.   ??? Licorice, Glycyrrhiza glabra, (LICORICE PO) Take by mouth.   ??? LITHIUM PO Take 5 mg by mouth two (2) times daily.   ??? loratadine-pseudoephedrine 10-240 mg tablet Take 1 tablet by mouth daily.   ???  mirtazapine 15 mg tablet Take 15 mg by mouth.   ??? mirtazapine 15 mg tablet TAKE 1 TABLET BY MOUTH EVERY DAY AT NIGHT   ??? Misc Natural Products (DANDELION ROOT PO) Take by mouth.   ??? ondansetron 4 mg tablet Take 1 tablet (4 mg total) by mouth every six (6) hours as needed for Nausea.   ??? PATADAY 0.2 % ophthalmic solution    ??? trazodone 50 mg tablet TAKE 1 TO 3 TABLETS BY MOUTH AT BEDTIME AS NEEDED FOR INSOMNIA.   ??? UNABLE TO FIND Citrigen .   ??? UNABLE TO FIND Heavy metal detox spray .   ??? cetirizine 5 mg tablet      No facility-administered medications prior to visit.      Allergies     Allergies   Allergen Reactions   ??? Clindamycin    ??? Nitrofurantoin    ??? Tea      Green tea   ??? Fluconazole Hives   ??? Metronidazole Hives   ??? Sulfa Antibiotics Hives     Review of Systems   Const:  ENT:   Resp:   GI:    Gyn:   Musculo:  Mid back pain, low back pain, neck shoulder pain  Skin:    Neuro:    Psych:  stress  Physical Examination   BP (!) 84/48  ~ Pulse 52  ~ Temp 36.9 ???C (98.4 ???F) (Temporal)  ~ Ht 5' 3'' (1.6 m)  ~ Wt 116 lb (52.6 kg)  ~ BMI 20.55 kg/m???   General: Well-appearing, no distress; no audible wheezing, no resp distress  Eyes: Eyelids normal. Conjunctiva normal.   ENMT: External ears and nose normal.   Neck: Neck symmetric.    Cardiovasc: No lower extremity edema.   Musculo: Tenderness to palpation as noted on annotated image.   Skin: Rashes none.  Psych: Mood and affect normal. Trigger/Tender Point Identification: (see annotated image)      Laboratory studies / Diagnostics / Imaging   Obtained and reviewed old records from [facility].    Assessment / Plan   Vanessa Jensen is a 32 y.o. female with  1. Strain of muscle and tendon of back wall of thorax, sequela  Myofascial Release Massage    Acupuncture    Inject TendonSheath/Ligament BP   2. Strain of neck muscle, subsequent encounter  Myofascial Release Massage    Acupuncture    Inject TendonSheath/Ligament BP   3. Chronic left-sided low back pain without sciatica  Myofascial Release Massage    Acupuncture    Inject TendonSheath/Ligament BP   4. Mid back pain  Myofascial Release Massage    Acupuncture    Inject TendonSheath/Ligament BP   5. Myofascial pain  Myofascial Release Massage    Acupuncture    Inject TendonSheath/Ligament BP   6. Allergic rhinitis, unspecified seasonality, unspecified trigger  Myofascial Release Massage    Acupuncture    Inject TendonSheath/Ligament BP        Plan  #Neck strain, sequela  -TPI today    #Thoracic strain, sequela  -TPI today    #Mid back pain  -MFR cupping today  -Acupuncture today  -cane massager   -continue stretches  -f/u PT  -heat    #Chronic low back pain, left-sided  -Follow up spine surgery as per patient, facet joint injection  -f/u PT    #Myofascial pain  -Recommend self massage, stretching, and thermotherapy.  -ibuprofen as needed    #Fatigue  -Recommend american ginseng    #  Asthma - stable  -albuterol inhaler  -Recommend establish care with PCP   -zyrtec refilled at patient request      Reviewed treatments, and treatment benefits, risks, alternatives with patient. Patients opts for today: acupuncture, TPI, MFR    Acupuncture Points: (KD3, SP6, UB23, L5 HTJJ, UB10, C5 HTJJ, GB21; (L) Iliocostalis Ashi, QL Ashi x2, [place], SI Joint Ashi x2, UB31; (R) SI11, SI10, Latissimus Dorsi Ashi, Paraspinal Ashi x4, Rhomboid Ashi, SI13; Electro 5/100Hz  (L) QL Ashi<->L5 HTJJ, (R) Paraspinal Ashi x2) Acupuncture Treatment Duration: 20 Minutes  Myofascial Release Massage Duration: 8 Min(ST. CUPPING: neck, shoulders, R upper back, L lower back)  Injection of : Patient Tolerated Procedure Well, Tendon Sheath with 0.65mL 1% lidocaine     Trapezius, Splenius cervicis Rhomboids, Levator scapulae, Infraspinatus, Teres minor(right) Latissimus dorsi(right)              Return in about 1 week (around 08/03/2018).     Patient was seen and examined with Barton Dubois, LAc.  I was present/available on site while procedure(s) was/were performed and agree with findings/assessment/plan we formulated together as documented above.    Humza Tallerico B. Leane Call, MD  07/27/2018 9:56 AM    Patient Instructions   F/u ortho for facet joint injection  Heat to affected areas    F/u PT  Cane massager  Shoulder stretches  Albuterol, ibuprofen as directed

## 2018-08-03 ENCOUNTER — Ambulatory Visit: Payer: BLUE CROSS/BLUE SHIELD

## 2018-08-25 ENCOUNTER — Ambulatory Visit: Payer: BLUE CROSS/BLUE SHIELD

## 2018-08-25 DIAGNOSIS — S29012S Strain of muscle and tendon of back wall of thorax, sequela: Secondary | ICD-10-CM

## 2018-08-25 DIAGNOSIS — M7918 Myalgia, other site: Secondary | ICD-10-CM

## 2018-08-25 DIAGNOSIS — M542 Cervicalgia: Secondary | ICD-10-CM

## 2018-08-25 DIAGNOSIS — M25519 Pain in unspecified shoulder: Secondary | ICD-10-CM

## 2018-08-25 DIAGNOSIS — M545 Low back pain: Secondary | ICD-10-CM

## 2018-08-25 DIAGNOSIS — G8929 Other chronic pain: Secondary | ICD-10-CM

## 2018-08-25 DIAGNOSIS — S161XXS Strain of muscle, fascia and tendon at neck level, sequela: Secondary | ICD-10-CM

## 2018-08-25 NOTE — Progress Notes
[facility] Progress Note   Patient: Vanessa Jensen MRN: [id number] Date of Birth: 06-16-1986 Date of Service: 08/25/2018   Referring Provider: No ref. provider found Primary Care Provider: Pcp, No, MD  Chief Complaint   Back Pain; Neck Pain; and Shoulder Pain    History of Present Illness   Vanessa Jensen is a 33 y.o. female who presents with Back Pain; Neck Pain; and Shoulder Pain    Interval history  08/25/18  Pain Location: Left, Lower, Back, Right, Shoulder, Neck  Pain: Stable  Sleep: Stable  Acupuncture Comments: Last Tx: can't remember. CC: L LBP, R upper back/shoulder pain, neck tension, dryness. L LBP most bothersome, rated 8/10, baclofen helps, considering f/u with spine doctor. R upper back/shoulder pain rated 3-4/10 (last tx 3/10), sharp pain medial to scapula with deep dull achy pain in the R shoulder. Neck is tight, frequent HAs. Feeling dry in general, possible Sjogren's flare, frequent urination, cough with occasional expectoration of phlegm. Sleep: slightly better, baclofen helps. Energy: tired, baclofen makes her drowsy.     Neck shoulder pain, worse on right 3-4/10  Left low back pain 8/10, baclofen helps  Stress and tired    07/27/18  Left low back pain stable 6/10  Right mid back pain stable 3/10  Left > right neck pain  Seeing outside ortho - planning to have facet joint injection  Seeing holistic doctor (outside)    Initial intake 05/29/16  Fatigue, energy level low  Even with good sleep, 8h/night, less than 5/10  Context of insomnia for years, stopped trazadone  Uses THC, smoked, which helps  ???  Headaches since high school, saw neurologist at the time, but could not figure out the cause, diagnosed with chronic mild migraines, brain imaging negative  Since a month ago, headaches more severe, temporal, currently 4/10. Can be severe up to 8-9/10  Sharp pain, not so much throbbing  Occurring in context of neck pain  ???  Neck pain, assoc stiffness, worse over past month Had TPI through immunologist, which helped, best relief for a couple days  Headaches improved a bit after neck pain improved  ???  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 Asthma    ??? CIN III (cervical intraepithelial neoplasia III)     s/p LEEP 12/16   ??? Depression with anxiety     prior suicide attempt   ??? History of alcohol abuse     h/o alchohol withdrawls   ??? History of eating disorder    ??? History of pancreatitis 2001   ??? History of substance abuse (HCC/RAF)    ??? Insomnia    ??? Migraines    ??? Sjogren's syndrome (HCC/RAF)    ??? Urticaria      Patient Active Problem List    Diagnosis Date Noted   ??? Sjogren's syndrome (HCC/RAF) 10/08/2016     Outpatient Medications Prior to Visit   Medication Sig   ???  albuterol 90 mcg/act inhaler Inhale 2 puffs every six (6) hours as needed.   ??? ARIPiprazole 5 mg tablet TAKE 1 TABLET BY MOUTH EVERY DAY   ??? beclomethasone 40 mcg/act inhaler Take 2 puffs by nebulization.   ??? cetirizine 5 mg tablet Take 1 tablet (5 mg total) by mouth daily as needed for Allergies.   ??? clonazePAM 0.5 mg tablet TAKE 1 TABLET BY MOUTH TWICE A DAY AS NEEDED   ??? fluoxetine 20 mg capsule TAKE ONE CAOSULE BY MOUTH EVERY MORNING   ??? hydroxychloroquine (PLAQUENIL) 200 mg tablet Take 1 tablet (200 mg total) by mouth daily.   ??? HYDROXYCHLOROQUINE 200 mg tablet TAKE 1 TABLET BY MOUTH EVERY DAY   ??? hydroxyzine 25 mg tablet ??? ibuprofen 600 mg tablet Take 1 tablet (600 mg total) by mouth every six (6) hours as needed.   ??? Licorice, Glycyrrhiza glabra, (LICORICE PO) Take by mouth.   ??? LITHIUM PO Take 5 mg by mouth two (2) times daily.   ??? loratadine-pseudoephedrine 10-240 mg tablet Take 1 tablet by mouth daily.   ??? mirtazapine 15 mg tablet Take 15 mg by mouth.   ??? mirtazapine 15 mg tablet TAKE 1 TABLET BY MOUTH EVERY DAY AT NIGHT   ??? Misc Natural Products (DANDELION ROOT PO) Take by mouth.   ??? ondansetron 4 mg tablet Take 1 tablet (4 mg total) by mouth every six (6) hours as needed for Nausea.   ??? PATADAY 0.2 % ophthalmic solution    ??? trazodone 50 mg tablet TAKE 1 TO 3 TABLETS BY MOUTH AT BEDTIME AS NEEDED FOR INSOMNIA.   ??? UNABLE TO FIND Citrigen .   ??? UNABLE TO FIND Heavy metal detox spray .     No facility-administered medications prior to visit.      Allergies     Allergies   Allergen Reactions   ??? Clindamycin    ??? Nitrofurantoin    ??? Tea      Green tea   ??? Fluconazole Hives   ??? Metronidazole Hives   ??? Sulfa Antibiotics Hives     Review of Systems   Const: fatigue  ENT:   Resp:   GI:    Gyn:   Musculo:  Mid back pain, low back pain, neck shoulder pain  Skin:    Neuro:    Psych:  stress  Physical Examination   BP 93/59  ~ Pulse 80  ~ Temp 36.8 ???C (98.3 ???F) (Temporal)  ~ Ht 5' 3'' (1.6 m)  ~ Wt 114 lb 12.8 oz (52.1 kg)  ~ BMI 20.34 kg/m???   General: Well-appearing, no distress; no audible wheezing, no resp distress  Eyes: Eyelids normal. Conjunctiva normal.   ENMT: External ears and nose normal.   Neck: Neck symmetric.    Cardiovasc: No lower extremity edema.   Musculo: Tenderness to palpation as noted on annotated image.   Skin: Rashes none.  Psych: Mood and affect normal.  Trigger/Tender Point Identification: (see annotated image)    Laboratory studies / Diagnostics / Imaging   Obtained and reviewed old records from [facility].    Assessment / Plan   Vanessa Jensen is a 33 y.o. female with 1. Strain of neck muscle, sequela  Inject TendonSheath/Ligament BP   2. Strain of rhomboid muscle, sequela  Inject TendonSheath/Ligament BP   3. Neck and shoulder pain  Myofascial Release Massage    Acupuncture   4. Chronic left-sided low back pain without sciatica  Myofascial Release Massage  Acupuncture   5. Myofascial pain  Myofascial Release Massage    Inject TendonSheath/Ligament BP        Plan  #Neck strain, sequela  -TPI today    #Rhomboid strain, sequela  -TPI today    #Neck shoulder pain  -MFR cupping today  -Acupuncture today  -cane massager   -continue stretches  -f/u PT  -heat    #Chronic low back pain, left-sided  -Acupuncture, MFr  -Follow up spine surgery as per patient, facet joint injection  -f/u PT    #Myofascial pain  -Recommend self massage, stretching, and thermotherapy.  -ibuprofen as needed  -TSI    Reviewed treatments, and treatment benefits, risks, alternatives with patient. Patients opts for today: acupuncture, TPI, MFR    Acupuncture Points: (KD3, KD6, KD10, LU9, UB10, C5 HTJJ, GB21, UB23, L5 HTJJ; (L) L4 HTJJ, UB31, SI Joint Ashi x3, Iliocostalis Ashi, QL Ashi x2; (R) SI13, SI11, SI10, Latissimus Dorsi Ashi, Rhomboid Ashi x2, Paraspinal Ashi x3; Electro 5/100Hz  (L) L4 HTJJ<->UB31; (R) UB x2)   Acupuncture Treatment Duration: 20 Minutes  Myofascial Release Massage Duration: 8 Min(ST. CUPPING: neck, shoulders, R upper back, mid back, L low back, L glutes)  Injection of : Patient Tolerated Procedure Well, Tendon Sheath with 0.12mL 1% lidocaine  Injection with:: 0.74mL of 1% lidocaine  Injection with:: 0.2 mL  Trapezius, Splenius capitis Levator scapulae, Rhomboids                Procedure note - Tendon sheath injection  Pre-op diagnosis: Myofascial pain  Post-op diagnosis: Myofascial pain  Procedure: Tendon sheath injection of Trapezius, Splenius capitis Levator scapulae, Rhomboids              Performing physician: Corliss Marcus, MD, MS  Dose: 1% Lidocaine 0.66mL Anesthetic (local): None administered  Procedure: Indication, benefit, risks, alternatives have been explained to the patient. Informed consent obtained from the patient. Each affected trigger point/tendon sheath area was localized by manual palpation and prepped with 70% isopropyl alcohol. After palpation and identification of each trigger point/tendon sheath, 25-gauge needle syringe was inserted into Trapezius, Splenius capitis Levator scapulae, Rhomboids             to appropriate depth. 1% Lidocaine 0.66mL was injected at each location. Needle syringe was withdrawn. The patient tolerated the procedure well without complications.    Follow-up: Standard post-procedure care was explained and return precautions were given. Follow-up appointment as noted.      Return in about 1 week (around 09/01/2018).     Patient was seen and examined with Barton Dubois, LAc.  I was present/available on site while procedure(s) was/were performed and agree with findings/assessment/plan we formulated together as documented above.    Corliss Marcus, MD, MS  08/25/2018 1:20 PM    Patient Instructions   F/u ortho for facet joint injection  Heat to affected areas    F/u PT  Cane massager  Shoulder stretches  Albuterol, ibuprofen as directed

## 2018-08-25 NOTE — Patient Instructions
F/u ortho for facet joint injection  Heat to affected areas    F/u PT  Cane massager  Shoulder stretches  Albuterol, ibuprofen as directed

## 2018-09-01 ENCOUNTER — Ambulatory Visit: Payer: BLUE CROSS/BLUE SHIELD

## 2018-09-03 ENCOUNTER — Ambulatory Visit: Payer: BLUE CROSS/BLUE SHIELD

## 2018-09-03 MED ORDER — ALBUTEROL SULFATE HFA 108 (90 BASE) MCG/ACT IN AERS
2 | Freq: Four times a day (QID) | RESPIRATORY_TRACT | 0 refills | Status: AC | PRN
Start: 2018-09-03 — End: 2018-10-12

## 2018-09-03 MED ORDER — ALBUTEROL SULFATE HFA 108 (90 BASE) MCG/ACT IN AERS
2 | Freq: Four times a day (QID) | RESPIRATORY_TRACT | 0 refills | Status: AC | PRN
Start: 2018-09-03 — End: 2018-09-04

## 2018-09-08 ENCOUNTER — Ambulatory Visit: Payer: BLUE CROSS/BLUE SHIELD

## 2018-09-08 DIAGNOSIS — S29012S Strain of muscle and tendon of back wall of thorax, sequela: Secondary | ICD-10-CM

## 2018-09-08 DIAGNOSIS — M549 Dorsalgia, unspecified: Secondary | ICD-10-CM

## 2018-09-08 DIAGNOSIS — G8929 Other chronic pain: Secondary | ICD-10-CM

## 2018-09-08 DIAGNOSIS — M25519 Pain in unspecified shoulder: Secondary | ICD-10-CM

## 2018-09-08 DIAGNOSIS — M542 Cervicalgia: Secondary | ICD-10-CM

## 2018-09-08 DIAGNOSIS — M7918 Myalgia, other site: Secondary | ICD-10-CM

## 2018-09-08 DIAGNOSIS — M545 Low back pain: Secondary | ICD-10-CM

## 2018-09-08 NOTE — Progress Notes
[facility] Progress Note   Patient: Vanessa Jensen MRN: [id number] Date of Birth: 1985/12/21 Date of Service: 09/08/2018   Referring Provider: No ref. provider found Primary Care Provider: Pcp, No, MD  Chief Complaint   Neck Pain    History of Present Illness   Vanessa Jensen is a 33 y.o. female who presents with Neck Pain    Interval history  09/08/18  Pain Location: Left, Lower, Back, Right, Upper, Shoulder  Pain: Stable  Sleep: Stable  Acupuncture Comments: Last Tx: can't recall. CC: L LBP, R upper back/shoulder pain, neck tension. L LBP 7/10 (rated 8/10), comes and goes, local. R upper back/shoulder pain rated 5/10 (last tx 3-4/10), annoying pressure. Neck tension better. Taking baclofen 10mg  BID, side effects include drowsiness, possibly weight loss (10# in 2 months). Stress: trying to slow down. Self care includes stretching. Has ortho f/u tomorrow.     Left low back pain 7/10, intermittent, most bothersome  Will see outside ortho tomorrow - planning to schedule facet joint injections  Right shoulder, mid back pain worse 5/10, mostly an annoyance  Doing heat and stretches    Indication: Myofascial shoulder, mid and low back pain   Consent: The risks (infection, bleeding, pain) and benefits of tendon sheath injections were explained to the patient.  The patient verbalized understanding and consented to tendon sheath injections.  Sterilization: The target area of the tendon sheath points was sterilized using an alcohol pad.  Procedure: Using a 23 or 25 gauge 1 inch needle, 0.2 cc of lidocaine was injected in a sterile fashion into each of the tendon sheath points.     There was no significant blood loss.  The patient tolerated the procedure well.      06/21/18  Left low back pain, 6/10  Right upper and mid back pain, 4/10  Headaches, dry sensation, associated fatigue, burning pain in feet    06/07/18  Right mid back pain persists, temporary relief with treatment, 3/10 Left low back pain, persists, 5-6/10  Sore throat resolved    06/07/18  Right mid back pain, medial to scapula  Chronic left low back pain  Last treatment helped pain for few days  Seeing PT which is working on both areas  Has not obtained cane massager  Would like to continue TPIs today    05/31/18  Sore throat resolved - attributes to detoxing from supplements  Right shoulder and mid back pain, pain along medial scapula  Doing stretches, discussed cane massager  Sees PT  Requests refill on albuterol inhaler today  Requests refill on ibuprofen which she takes as needed  Stopped meloxicam since was not helpful  Discussed trigger point injections (TPIs) with associated risks/benefits, patient would like to receive TPIs today for mid back and shoulder strain      05/11/18  Mid back pain, worse on right  Low back pain, on left  Has been using boric acid and tea tree oil suppository for yeast infection    05/03/18  Nasal and sinus congestion better since taking augmentin  States having pelvic pain, attributes to yeast infection, cannot take fluconazole, needs boric acid, in contact with gynecologist  Residual cough, decreased phlegm  Mid back pain, worse on right side, persists  Also left low back pain, radiating into left buttocks    04/26/18  Nasal and sinus congestion persists  Mid back pain, right sided    04/22/18  Right mid back pain  Sore throat a week ago, nasal congestion and sneezing,  cough with variable white and yellow sputum  Sore throat better, but some residual throat discomfort  No fevers or chills  Was on plane and friends also came down with similar symptoms    04/14/18  Was in [place] visiting friends and family for 2 weeks, feels better after trip, has better perspective  Mid back pain, worse on right   Also pain in arms and hands    Initial intake 05/29/16  Fatigue, energy level low  Even with good sleep, 8h/night, less than 5/10  Context of insomnia for years, stopped trazadone Uses THC, smoked, which helps  ???  Headaches since high school, saw neurologist at the time, but could not figure out the cause, diagnosed with chronic mild migraines, brain imaging negative  Since a month ago, headaches more severe, temporal, currently 4/10. Can be severe up to 8-9/10  Sharp pain, not so much throbbing  Occurring in context of neck pain  ???  Neck pain, assoc stiffness, worse over past month  Had TPI through immunologist, which helped, best relief for a couple days  Headaches improved a bit after neck pain improved  ???  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 Asthma    ??? CIN III (cervical intraepithelial neoplasia III)     s/p LEEP 12/16   ??? Depression with anxiety     prior suicide attempt   ??? History of alcohol abuse     h/o alchohol withdrawls   ??? History of eating disorder    ??? History of pancreatitis 2001   ??? History of substance abuse (HCC/RAF)    ??? Insomnia    ??? Migraines    ??? Sjogren's syndrome (HCC/RAF)    ??? Urticaria      Patient Active Problem List    Diagnosis Date Noted   ??? Sjogren's syndrome (HCC/RAF) 10/08/2016     Outpatient Medications Prior to Visit   Medication Sig   ??? albuterol 90 mcg/act inhaler Inhale 2 puffs every six (6) hours as needed.   ??? ARIPiprazole 5 mg tablet TAKE 1 TABLET BY MOUTH EVERY DAY   ??? beclomethasone 40 mcg/act inhaler Take 2 puffs by nebulization.   ??? cetirizine 5 mg tablet Take 1 tablet (5 mg total) by mouth daily as needed for Allergies.   ??? clonazePAM 0.5 mg tablet TAKE 1 TABLET BY MOUTH TWICE A DAY AS NEEDED   ??? fluoxetine 20 mg capsule TAKE ONE CAOSULE BY MOUTH EVERY MORNING   ??? hydroxychloroquine (PLAQUENIL) 200 mg tablet Take 1 tablet (200 mg total) by mouth daily.   ??? HYDROXYCHLOROQUINE 200 mg tablet TAKE 1 TABLET BY MOUTH EVERY DAY   ??? hydroxyzine 25 mg tablet    ??? ibuprofen 600 mg tablet Take 1 tablet (600 mg total) by mouth every six (6) hours as needed.   ??? Licorice, Glycyrrhiza glabra, (LICORICE PO) Take by mouth.   ??? LITHIUM PO Take 5 mg  by mouth two (2) times daily.   ??? loratadine-pseudoephedrine 10-240 mg tablet Take 1 tablet by mouth daily.   ??? mirtazapine 15 mg tablet Take 15 mg by mouth.   ??? mirtazapine 15 mg tablet TAKE 1 TABLET BY MOUTH EVERY DAY AT NIGHT   ??? Misc Natural Products (DANDELION ROOT PO) Take by mouth.   ??? ondansetron 4 mg tablet Take 1 tablet (4 mg total) by mouth every six (6) hours as needed for Nausea.   ??? PATADAY 0.2 % ophthalmic solution    ??? trazodone 50 mg tablet TAKE 1 TO 3 TABLETS BY MOUTH AT BEDTIME AS NEEDED FOR INSOMNIA.   ??? UNABLE TO FIND Citrigen .   ??? UNABLE TO FIND Heavy metal detox spray .     No facility-administered medications prior to visit.      Allergies     Allergies   Allergen Reactions   ??? Clindamycin    ??? Nitrofurantoin    ??? Tea      Green tea   ??? Fluconazole Hives   ??? Metronidazole Hives   ??? Sulfa Antibiotics Hives     Review of Systems   Const:  ENT:   Resp:   GI:    Gyn:   Musculo:  Mid back pain, low back pain, neck shoulder pain  Skin:    Neuro:    Psych:  stress  Physical Examination   BP 100/58  ~ Pulse 67  ~ Temp 36.7 ???C (98.1 ???F) (Temporal)  ~ Ht 5' 3'' (1.6 m)  ~ Wt 114 lb (51.7 kg)  ~ BMI 20.19 kg/m???   General: Well-appearing, no distress; no audible wheezing, no resp distress  Eyes: Eyelids normal. Conjunctiva normal.   ENMT: External ears and nose normal.   Neck: Neck symmetric.    Cardiovasc: No lower extremity edema. Musculo: Tenderness to palpation as noted on annotated image.   Skin: Rashes none.  Psych: Mood and affect normal.  Trigger/Tender Point Identification: (see annotated image)      Laboratory studies / Diagnostics / Imaging   Obtained and reviewed old records from [facility].    Assessment / Plan   Vanessa Jensen is a 33 y.o. female with  1. Strain of muscle and tendon of back wall of thorax, sequela  Myofascial Release Massage    Acupuncture    Inject TendonSheath/Ligament BP   2. Neck and shoulder pain  Myofascial Release Massage    Acupuncture    Inject TendonSheath/Ligament BP   3. Chronic left-sided low back pain without sciatica  Myofascial Release Massage    Acupuncture    Inject TendonSheath/Ligament BP   4. Myofascial pain  Myofascial Release Massage    Acupuncture    Inject TendonSheath/Ligament BP   5. Mid back pain  Myofascial Release Massage    Acupuncture    Inject TendonSheath/Ligament BP        Plan    #Thoracic strain, sequela  -TPI today    #Mid back pain  -MFR cupping today  -Acupuncture today  -cane massager   -continue stretches  -f/u PT  -heat    #Chronic low back pain, left-sided  -Follow up spine surgery as per patient, facet joint injection  -f/u PT    #Myofascial pain  -Recommend self massage, stretching, and thermotherapy.  -ibuprofen as needed    #Fatigue  -Recommend american ginseng    #Asthma - stable  -albuterol inhaler  -Recommend establish care with PCP   -zyrtec refilled at patient request  Reviewed treatments, and treatment benefits, risks, alternatives with patient. Patients opts for today: acupuncture, TSI, MFR    Acupuncture Points: (KD3, UB20, UB23, UB10, GB21; (L) UB62, L4 HTJJ, L5 HTJJ, UB31, Iliocostalis Ashi, QL Ashi x2, SI Joint Ashi; (R) SI13, SI12, SI11, SI10, ZO10, LI15, Lower Trap Ashi, Paraspinal Ashi x3, Latissimus Dorsi Ashi; Electro 2/100Hz  L QL Ashi:L5 HTJJ, R Back x2)   Acupuncture Treatment Duration: 20 Minutes Myofascial Release Massage Duration: 8 Min(ST. CUPPING: neck, shoulders, upper back, L low back, L glutes)  Injection of : Tendon Sheath with 0.97mL 1% lidocaine, Patient Tolerated Procedure Well     Trapezius, Splenius cervicis Levator scapulae, Rhomboids(right) Latissimus dorsi(right) Longissimus, Iliocostalis(left)            Return in about 2 weeks (around 09/22/2018).     Patient was seen and examined with Barton Dubois, LAc.  I was present/available on site while procedure(s) was/were performed and agree with findings/assessment/plan we formulated together as documented above.    Shahzain Kiester B. Shaneen Reeser, MD  09/08/2018 10:30 AM    Patient Instructions   F/u ortho for facet joint injection  Heat to affected areas    F/u PT  Cane massager  Shoulder stretches  Albuterol, ibuprofen as directed

## 2018-09-08 NOTE — Patient Instructions
F/u ortho for facet joint injection  Heat to affected areas    F/u PT  Cane massager  Shoulder stretches  Albuterol, ibuprofen as directed

## 2018-09-10 ENCOUNTER — Ambulatory Visit: Payer: BLUE CROSS/BLUE SHIELD

## 2018-09-10 DIAGNOSIS — Z113 Encounter for screening for infections with a predominantly sexual mode of transmission: Secondary | ICD-10-CM

## 2018-09-10 DIAGNOSIS — N760 Acute vaginitis: Secondary | ICD-10-CM

## 2018-09-11 LAB — Bacterial Vaginosis Screen

## 2018-09-11 LAB — Fungal Culture: FUNGAL CULTURE: NEGATIVE

## 2018-09-11 LAB — HCV Ab Screen: HCV ANTIBODY SCREEN: NONREACTIVE

## 2018-09-11 LAB — RPR: RPR: NONREACTIVE

## 2018-09-11 LAB — HBs Ag: HEPATITIS B SURFACE ANTIGEN: NONREACTIVE

## 2018-09-11 LAB — HIV-1/2 Ag/Ab 4th Generation with Reflex Confirmation: HIV-1/2 AG/AB 4TH GENERATION WITH REFLEX CONFIRMATION: NONREACTIVE

## 2018-09-11 MED ORDER — OSELTAMIVIR PHOSPHATE 75 MG PO CAPS
75 mg | ORAL_CAPSULE | Freq: Two times a day (BID) | ORAL | 0 refills | Status: AC
Start: 2018-09-11 — End: ?

## 2018-09-11 MED ORDER — NORETHINDRONE ACET-ETHINYL EST 1-20 MG-MCG PO TABS
1 | ORAL_TABLET | Freq: Every day | ORAL | 11 refills | Status: AC
Start: 2018-09-11 — End: ?

## 2018-09-11 NOTE — Addendum Note
Addended by: Lavonda Jumbo on: 09/10/2018 05:03 PM     Modules accepted: Orders

## 2018-09-11 NOTE — Progress Notes
[facility]   OBSTETR[facility], GYNECOLOGY & UROGYNECOLOGY     PATIENT: Vanessa Jensen  MRN: [id number]  DOB: 08/28/1985  DATE OF SERVICE: 09/10/2018  PCP: Pcp, No, MD    CC: No chief complaint on file.      SUBJECTIVE:     Vanessa Jensen is a 33 y.o. G1P0010 here for STI screening, also feeling like she's developing cold/flu like symptoms.     Pt had new partner, relationship ended and pt concerned about STI.   Would like blood STI labs, GC/CT.   Also reports some vaginal discharge and itching. Struggles with yeast infections.     Pt was on ocps in the remote past, would like to restart. Had used Plan B 3 times already the past year.     Patient's last menstrual period was 09/01/2018.      ROS: Has some vaginal/bladder irritation.   No F/C. But congestion and feeling tired. Louann Sjogren was sick earlier this week.     OB History   Gravida Para Term Preterm AB Living   1       1     SAB TAB Ectopic Multiple Live Births   1              # Outcome Date GA Lbr Len/2nd Weight Sex Delivery Anes PTL Lv   1 SAB 2015              Obstetric Comments   Hx of LEEP in 2006 and 2015. Last pap 03/2017.        Past Medical History:   Diagnosis Date   ??? Asthma    ??? CIN III (cervical intraepithelial neoplasia III)     s/p LEEP 12/16   ??? Depression with anxiety     prior suicide attempt   ??? History of alcohol abuse     h/o alchohol withdrawls   ??? History of eating disorder    ??? History of pancreatitis 2001   ??? History of substance abuse (HCC/RAF)    ??? Insomnia    ??? Migraines    ??? Sjogren's syndrome (HCC/RAF)    ??? Urticaria        Past Surgical History:   Procedure Laterality Date   ??? CERVICAL BIOPSY  W/ LOOP ELECTRODE EXCISION  2006, 07/19/2015   ??? DILATION AND CURETTAGE OF UTERUS     ??? TOE SURGERY  08/2014   ??? WRIST SURGERY  03/2015    dislocated ulna, torn tendon       Family History   Problem Relation Age of Onset   ??? Crohn's disease Sister         half sister       Social History     Socioeconomic History   ??? Marital status: Single Spouse name: Not on file   ??? Number of children: Not on file   ??? Years of education: Not on file   ??? Highest education level: Not on file   Occupational History   ??? Not on file   Social Needs   ??? Financial resource strain: Not on file   ??? Food insecurity:     Worry: Not on file     Inability: Not on file   ??? Transportation needs:     Medical: Not on file     Non-medical: Not on file   Tobacco Use   ??? Smoking status: Current Every Day Smoker   ??? Smokeless tobacco: Never Used   Substance and Sexual Activity   ???  Alcohol use: Not on file   ??? Drug use: Yes     Types: Marijuana   ??? Sexual activity: Yes     Partners: Male   Lifestyle   ??? Physical activity:     Days per week: Not on file     Minutes per session: Not on file   ??? Stress: Not on file   Relationships   ??? Social connections:     Talks on phone: Not on file     Gets together: Not on file     Attends religious service: Not on file     Active member of club or organization: Not on file     Attends meetings of clubs or organizations: Not on file     Relationship status: Not on file   Other Topics Concern   ??? Not on file   Social History Narrative   ??? Not on file     OBJECTIVE:     BP 112/69  ~ Pulse 54  ~ Ht 5' 3'' (1.6 m)  ~ Wt 113 lb 6.4 oz (51.4 kg)  ~ LMP 09/01/2018  ~ BMI 20.09 kg/m???     Gen: No acute distress, well appearing  Lungs: normal work of breathing  Ab: soft, non-tender, non-distended  Neuro: alert and oriented x 3    Pelvic exam:   VULVA: normal appearing vulva with no masses, tenderness or lesions  VAGINA: normal appearing vagina, estrogenized, no lesions or masses, white discharge  CERVIX: normal appearing cervix without discharge or lesions  UTERUS: uterus is normal size, shape, consistency and nontender  ADNEXA: no adnexal masses or ttp      ASSESSMENT/PLAN:   Rheanna Sergent is a 33 y.o. female   1. Acute vaginitis    2. Screening examination for STD (sexually transmitted disease)      Vaginitis panel, GC/CT  Upreg negative Blood STI labs done  Tamiflu for likely flu given sx.   Microgestin rx, ACHES reviewed.   ORDERS:     Orders Placed This Encounter   ??? Bacterial Vaginosis Screen   ??? Fungal Culture, Genital Swab   ??? Bacterial Culture Urine, Clean Catch   ??? Chlamydia trachomatis/Neisseria gonorrhoeae PCR, Urine   ??? RPR   ??? HIV-1/2 Ag/Ab 4th Generation with Reflex Confirmation   ??? HBS Antigen   ??? HCV Antibody Screen   ??? oseltamivir (TAMIFLU) 75 mg capsule   ??? norethindrone-ethinyl estradiol (MICROGESTIN 1/20) 1-20 mg-mcg tablet       Future Appointments   Date Time Provider Department Center   09/15/2018  9:00 AM Barton Dubois [place], [place] [facility]   12/01/2018 11:00 AM Lutricia Horsfall, MD Rheumatology MEDICINE       Larey Seat MD,   [facility]

## 2018-09-12 LAB — Bacterial Culture Urine: BACTERIAL CULTURE URINE: NO GROWTH {titer}

## 2018-09-13 LAB — Chlamydia trachomatis/Neisseria gonorrhoeae PCR: CHLAMYDIA TRACHOMATIS PCR: NEGATIVE

## 2018-09-13 LAB — Fungal Culture: FUNGAL CULTURE: NEGATIVE

## 2018-09-15 ENCOUNTER — Ambulatory Visit: Payer: BLUE CROSS/BLUE SHIELD

## 2018-09-28 MED ORDER — CETIRIZINE HCL 5 MG PO TABS
ORAL_TABLET | 0 refills | Status: AC
Start: 2018-09-28 — End: 2018-11-05

## 2018-10-06 ENCOUNTER — Ambulatory Visit: Payer: BLUE CROSS/BLUE SHIELD

## 2018-10-06 DIAGNOSIS — S29012S Strain of muscle and tendon of back wall of thorax, sequela: Secondary | ICD-10-CM

## 2018-10-06 DIAGNOSIS — R1013 Epigastric pain: Secondary | ICD-10-CM

## 2018-10-06 DIAGNOSIS — M549 Dorsalgia, unspecified: Secondary | ICD-10-CM

## 2018-10-06 DIAGNOSIS — R05 Cough: Secondary | ICD-10-CM

## 2018-10-06 DIAGNOSIS — M542 Cervicalgia: Secondary | ICD-10-CM

## 2018-10-06 DIAGNOSIS — M25519 Pain in unspecified shoulder: Secondary | ICD-10-CM

## 2018-10-06 NOTE — Progress Notes
[facility] Progress Note   Patient: Vanessa Jensen MRN: [id number] Date of Birth: [birth year] Date of Service: 10/06/2018   Referring Provider: No ref. provider found Primary Care Provider: Pcp, No, MD  Chief Complaint   Cough and Neck Pain    History of Present Illness   Vanessa Jensen is a 33 y.o. female who presents with Cough and Neck Pain    Interval history  10/06/18      Cough with clear sputum, associated sinus congestion with clear sputum  Reports associated wheezing  Using albuterol 3-5x/day  Sick contact with mother and sister who flew in from [place], nil sick contacts from [place]  Cough persistent for 4 weeks, occurs throughout day, not worse in morning  Also experiencing weight loss, estimates 10 lbs over past 6 weeks  Neck shoulder and mid back pain  Still needs to establish with primary care    09/08/18  Left low back pain 7/10, intermittent, most bothersome  Will see outside ortho tomorrow - planning to schedule facet joint injections  Right shoulder, mid back pain worse 5/10, mostly an annoyance  Doing heat and stretches    Initial intake 05/29/16  Fatigue, energy level low  Even with good sleep, 8h/night, less than 5/10  Context of insomnia for years, stopped trazadone  Uses THC, smoked, which helps  ???  Headaches since high school, saw neurologist at the time, but could not figure out the cause, diagnosed with chronic mild migraines, brain imaging negative  Since a month ago, headaches more severe, temporal, currently 4/10. Can be severe up to 8-9/10  Sharp pain, not so much throbbing  Occurring in context of neck pain  ???  Neck pain, assoc stiffness, worse over past month  Had TPI through immunologist, which helped, best relief for a couple days  Headaches improved a bit after neck pain improved  ???  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 Asthma    ??? CIN III (cervical intraepithelial neoplasia III)     s/p LEEP 12/16   ??? Depression with anxiety     prior suicide attempt   ??? History of alcohol abuse     h/o alchohol withdrawls   ??? History of eating disorder    ??? History of pancreatitis 2001   ??? History of substance abuse (HCC/RAF)    ??? Insomnia    ??? Migraines    ??? Sjogren's syndrome (HCC/RAF)    ??? Urticaria      Patient Active Problem List    Diagnosis Date Noted   ??? Sjogren's syndrome (HCC/RAF) 10/08/2016     Outpatient Medications Prior to Visit   Medication Sig   ??? albuterol 90 mcg/act inhaler Inhale 2 puffs every six (6) hours as needed.   ??? ARIPiprazole 5 mg tablet TAKE 1 TABLET BY MOUTH EVERY DAY   ??? beclomethasone 40 mcg/act inhaler Take 2 puffs by nebulization.   ??? CETIRIZINE 5  mg tablet TAKE 1 TABLET (5 MG TOTAL) BY MOUTH DAILY AS NEEDED FOR ALLERGIES. (NOT COV)   ??? clonazePAM 0.5 mg tablet TAKE 1 TABLET BY MOUTH TWICE A DAY AS NEEDED   ??? fluoxetine 20 mg capsule TAKE ONE CAOSULE BY MOUTH EVERY MORNING   ??? hydroxychloroquine (PLAQUENIL) 200 mg tablet Take 1 tablet (200 mg total) by mouth daily.   ??? HYDROXYCHLOROQUINE 200 mg tablet TAKE 1 TABLET BY MOUTH EVERY DAY   ??? hydroxyzine 25 mg tablet    ??? ibuprofen 600 mg tablet Take 1 tablet (600 mg total) by mouth every six (6) hours as needed.   ??? Licorice, Glycyrrhiza glabra, (LICORICE PO) Take by mouth.   ??? LITHIUM PO Take 5 mg by mouth two (2) times daily.   ??? loratadine-pseudoephedrine 10-240 mg tablet Take 1 tablet by mouth daily. ??? mirtazapine 15 mg tablet Take 15 mg by mouth.   ??? mirtazapine 15 mg tablet TAKE 1 TABLET BY MOUTH EVERY DAY AT NIGHT   ??? Misc Natural Products (DANDELION ROOT PO) Take by mouth.   ??? norethindrone-ethinyl estradiol (MICROGESTIN 1/20) 1-20 mg-mcg tablet Take 1 tablet by mouth daily.   ??? ondansetron 4 mg tablet Take 1 tablet (4 mg total) by mouth every six (6) hours as needed for Nausea.   ??? PATADAY 0.2 % ophthalmic solution    ??? trazodone 50 mg tablet TAKE 1 TO 3 TABLETS BY MOUTH AT BEDTIME AS NEEDED FOR INSOMNIA.   ??? UNABLE TO FIND Citrigen .   ??? UNABLE TO FIND Heavy metal detox spray .     No facility-administered medications prior to visit.      Allergies     Allergies   Allergen Reactions   ??? Clindamycin    ??? Nitrofurantoin    ??? Tea      Green tea   ??? Fluconazole Hives   ??? Metronidazole Hives   ??? Sulfa Antibiotics Hives     Review of Systems   Const:  ENT:   Resp: cough  GI:    Gyn:   Musculo:  Neck shoulder pain, mid back pain  Skin:    Neuro:    Psych:  stress  Physical Examination   BP 122/79  ~ Pulse 76  ~ Temp 36.5 ???C (97.7 ???F) (Temporal)  ~ Resp 14  ~ Ht 5' 3'' (1.6 m)  ~ Wt 113 lb (51.3 kg)  ~ SpO2 98%  ~ BMI 20.02 kg/m???   General: Well-appearing, no distress; no audible wheezing, no resp distress  Eyes: Eyelids normal. Conjunctiva normal.   ENMT: External ears and nose normal.   Neck: Neck symmetric.    Cardiovasc: No lower extremity edema.   Musculo: Tenderness to palpation as noted on annotated image.   Skin: Rashes none.  Psych: Mood and affect normal.  Trigger/Tender Point Identification: (see annotated image)  No images are attached to the encounter.  Laboratory studies / Diagnostics / Imaging   Obtained and reviewed old records from [facility].    Assessment / Plan   Deyanira Fesler is a 33 y.o. female with  1. Cough  XR chest pa+lat (2 views)   2. Neck and shoulder pain  Myofascial Release Massage   3. Mid back pain  Myofascial Release Massage 4. Strain of rhomboid muscle, sequela  Inject TendonSheath/Ligament BP   5. Epigastric pain          Plan  #Cough, sick contacts recently, background of asthma  -CXR  -Benzonatate tid, refill  if needed  -Recommend ginger tea  -Antibiotics not indicated at this time. Further evaluation with CXR.    #Neck shoulder pain  -Cupping MFR today  -Defer acupuncture due to time    #Mid back pain  -Cupping MFR today  -Defer acupuncture due to time    #Rhomboid strain, sequela  -Recommend TSI today    #Epigastric pain, suspect abdominal muscle strain from coughing  -Treat cough  -Monitor for improvement    #Asthma  -Continue albuterol use  -Recommend establishing care with primary care provider  -Follow up CXR  -Consider prednisone course if symptoms getting worse, not improving, nil evidence of bacterial infection    Reviewed treatments, and treatment benefits, risks, alternatives with patient. Patients opts for today: TSI, MFR        Myofascial Release Massage Duration: (15 minutes, cupping - neck shoulder mid back)  Injection of : Tendon Sheath with 0.44mL 1% lidocaine  Injection with:: 0.30mL of 1% lidocaine  Trapezius, Splenius capitis Levator scapulae, Rhomboids, Infraspinatus                Procedure note - Tendon sheath injection    Procedure: Tendon sheath injection of Trapezius, Splenius capitis Levator scapulae, Rhomboids, Infraspinatus              Performing physician: Corliss Marcus, MD, MS  Dose: Injection with:: 0.84mL of 1% lidocaine  Anesthetic (local): None administered  Procedure: Indication, benefit, risks, and alternatives have been explained to the patient with questions answered to satisfaction and patient verbalizing understanding and consenting to procedure. The affected area was localized by manual palpation, confirmed by presence of tenderness to palpation, and prepped with 70% isopropyl alcohol. After palpation and identification of the affected area, 25-gauge needle syringe was inserted into Trapezius, Splenius capitis Levator scapulae, Rhomboids, Infraspinatus             to a depth of 1 cm. Injection with:: 0.32mL of 1% lidocaine was administered. Syringe needle was subsequently withdrawn. Pressure was applied after syringe needle was withdrawn. No bleeding was observed after withdrawal of needle. Patient tolerated procedure well without complications.   Follow-up: Standard post-procedure care reviewed. Return precautions were given. Follow-up appointment as noted.    Return in about 1 week (around 10/13/2018).       Corliss Marcus, MD, MS  10/06/2018 8:10 AM    Patient Instructions   Take benzonatate 3 times a day for cough. Decrease use when cough improving. If need refills, please contact the office or have pharmacy contact the office.     Will follow up on Chest X-ray when report returns.    Please establish with primary care provider as recommended.

## 2018-10-06 NOTE — Patient Instructions
Take benzonatate 3 times a day for cough. Decrease use when cough improving. If need refills, please contact the office or have pharmacy contact the office.     Will follow up on Chest X-ray when report returns.    Please establish with primary care provider as recommended.    Fresh ginger tea   Prepation instructions:    Prepare with 2-3 slices of fresh ginger root (about 3mm in thickness) brewed in cup of hot water for 15-20 minutes before drinking (allow cooling to warm before drinking).     Drink 2 times a day with breakfast and lunch/early afternoon. Avoid drinking in the evening.    Can add peach juice or extract, or honey to improve flavoring.

## 2018-10-08 ENCOUNTER — Ambulatory Visit: Payer: BLUE CROSS/BLUE SHIELD

## 2018-10-11 ENCOUNTER — Ambulatory Visit: Payer: BLUE CROSS/BLUE SHIELD

## 2018-10-11 DIAGNOSIS — Z7689 Persons encountering health services in other specified circumstances: Secondary | ICD-10-CM

## 2018-10-11 DIAGNOSIS — F3181 Bipolar II disorder: Secondary | ICD-10-CM

## 2018-10-11 DIAGNOSIS — Z131 Encounter for screening for diabetes mellitus: Secondary | ICD-10-CM

## 2018-10-11 MED ORDER — ALBUTEROL SULFATE HFA 108 (90 BASE) MCG/ACT IN AERS
2 | Freq: Four times a day (QID) | RESPIRATORY_TRACT | 2 refills | Status: AC | PRN
Start: 2018-10-11 — End: 2019-01-19
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
## 2018-10-12 ENCOUNTER — Ambulatory Visit: Payer: BLUE CROSS/BLUE SHIELD

## 2018-10-12 DIAGNOSIS — M545 Low back pain: Secondary | ICD-10-CM

## 2018-10-12 DIAGNOSIS — S29012S Strain of muscle and tendon of back wall of thorax, sequela: Secondary | ICD-10-CM

## 2018-10-12 DIAGNOSIS — M7918 Myalgia, other site: Secondary | ICD-10-CM

## 2018-10-12 DIAGNOSIS — M25519 Pain in unspecified shoulder: Secondary | ICD-10-CM

## 2018-10-12 DIAGNOSIS — S161XXS Strain of muscle, fascia and tendon at neck level, sequela: Secondary | ICD-10-CM

## 2018-10-12 DIAGNOSIS — M542 Cervicalgia: Secondary | ICD-10-CM

## 2018-10-12 LAB — Hepatic Funct Panel
BILIRUBIN,TOTAL: 0.4 mg/dL (ref 0.1–1.2)
TOTAL PROTEIN: 7.5 g/dL (ref 6.1–8.2)

## 2018-10-12 LAB — Hgb A1c: HGB A1C - HPLC: 5.3 (ref <5.7)

## 2018-10-12 LAB — Differential Automated: ABSOLUTE EOS COUNT: 0.08 10*3/uL (ref 0.00–0.50)

## 2018-10-12 LAB — CBC: NEUTROPHILS ABS (PRELIM): 4.86 10*3/uL (ref 26.4–33.4)

## 2018-10-12 LAB — Basic Metabolic Panel
SODIUM: 139 mmol/L (ref 135–146)
TOTAL CO2: 21 mmol/L (ref 20–30)

## 2018-10-12 LAB — TSH with reflex FT4, FT3: TSH: 2.8 u[IU]/mL (ref 0.3–4.7)

## 2018-10-12 NOTE — Patient Instructions
F/u ortho and pain management  Heat to affected areas  Self massage (massage chair)    F/u PT  Cane massager  Shoulder stretches  Albuterol, ibuprofen as directed
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
## 2018-10-19 ENCOUNTER — Ambulatory Visit: Payer: BLUE CROSS/BLUE SHIELD

## 2018-11-04 ENCOUNTER — Telehealth: Payer: BLUE CROSS/BLUE SHIELD

## 2018-11-04 ENCOUNTER — Ambulatory Visit: Payer: BLUE CROSS/BLUE SHIELD

## 2018-11-04 MED ORDER — CETIRIZINE HCL 5 MG PO TABS
ORAL_TABLET | 0 refills | Status: AC
Start: 2018-11-04 — End: 2018-11-26

## 2018-11-04 NOTE — Telephone Encounter
New Rx Request    MD: Dr. Elmarie Shiley     Last seen by MD:  06/16/2018    Reason for the request: Pt stating she was in this medication and went natural for a while but pt stating she read into the corona virus and stated that the medication will help.     Any Symptoms:  []  Yes  [x]  No      ? If yes, what symptoms are you experiencing:     o Duration of symptoms (how long):    o Have you taken medication for symptoms (OTC or Rx):      Is a particular medication being requested?    Hydroxychloriquine     Was an appointment offered? Yes, pt is avoiding to go out any where due to her health issues.     Patient has been notified of the 24-48 hour turnaround time.

## 2018-11-04 NOTE — Telephone Encounter
Please see message from pt below...

## 2018-11-04 NOTE — Telephone Encounter
See note below please advice

## 2018-11-05 MED ORDER — HYDROXYCHLOROQUINE SULFATE 200 MG PO TABS
300 mg | ORAL_TABLET | Freq: Every day | ORAL | 1 refills | Status: AC
Start: 2018-11-05 — End: 2018-11-23

## 2018-11-06 NOTE — Telephone Encounter
Reply by: Lutricia Horsfall  Please let her know. Would not restart the medication for the coronavirus situation at this time. Not a good indication.   If would not be on it otherwise would not start it now.

## 2018-11-08 ENCOUNTER — Ambulatory Visit: Payer: BLUE CROSS/BLUE SHIELD

## 2018-11-10 ENCOUNTER — Ambulatory Visit: Payer: BLUE CROSS/BLUE SHIELD

## 2018-11-10 NOTE — Telephone Encounter
Please assist

## 2018-11-10 NOTE — Telephone Encounter
L/M - relaying Dr. Elmarie Shiley' message.

## 2018-11-12 DIAGNOSIS — M3500 Sicca syndrome, unspecified: Secondary | ICD-10-CM

## 2018-11-12 DIAGNOSIS — F419 Anxiety disorder, unspecified: Secondary | ICD-10-CM

## 2018-11-12 DIAGNOSIS — F3181 Bipolar II disorder: Secondary | ICD-10-CM

## 2018-11-12 DIAGNOSIS — F1011 Alcohol abuse, in remission: Secondary | ICD-10-CM

## 2018-11-12 DIAGNOSIS — G43909 Migraine, unspecified, not intractable, without status migrainosus: Secondary | ICD-10-CM

## 2018-11-12 DIAGNOSIS — R1011 Right upper quadrant pain: Secondary | ICD-10-CM

## 2018-11-12 DIAGNOSIS — R1012 Left upper quadrant pain: Secondary | ICD-10-CM

## 2018-11-12 DIAGNOSIS — J45909 Unspecified asthma, uncomplicated: Secondary | ICD-10-CM

## 2018-11-12 DIAGNOSIS — R05 Cough: Secondary | ICD-10-CM

## 2018-11-22 ENCOUNTER — Ambulatory Visit: Payer: BLUE CROSS/BLUE SHIELD

## 2018-11-22 ENCOUNTER — Telehealth: Payer: BLUE CROSS/BLUE SHIELD | Attending: Rheumatology

## 2018-11-22 DIAGNOSIS — M3500 Sicca syndrome, unspecified: Secondary | ICD-10-CM

## 2018-11-22 DIAGNOSIS — R5383 Other fatigue: Secondary | ICD-10-CM

## 2018-11-22 DIAGNOSIS — M25541 Pain in joints of right hand: Secondary | ICD-10-CM
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
## 2018-11-23 ENCOUNTER — Ambulatory Visit: Payer: BLUE CROSS/BLUE SHIELD

## 2018-11-23 NOTE — Telephone Encounter
Please schedule a telemedicine visit with Dr Theodoro Grist for when he is back.   Thanks  Clydie Braun

## 2018-11-26 MED ORDER — CETIRIZINE HCL 5 MG PO TABS
ORAL_TABLET | 0 refills | Status: AC
Start: 2018-11-26 — End: 2018-12-28

## 2018-12-01 ENCOUNTER — Telehealth: Payer: BLUE CROSS/BLUE SHIELD

## 2018-12-01 ENCOUNTER — Ambulatory Visit: Payer: BLUE CROSS/BLUE SHIELD | Attending: Rheumatology

## 2018-12-01 DIAGNOSIS — K920 Hematemesis: Secondary | ICD-10-CM

## 2018-12-01 DIAGNOSIS — J45901 Unspecified asthma with (acute) exacerbation: Secondary | ICD-10-CM

## 2018-12-01 DIAGNOSIS — Z7189 Other specified counseling: Secondary | ICD-10-CM

## 2018-12-01 MED ORDER — FLUTICASONE-SALMETEROL 500-50 MCG/DOSE IN AEPB
1 | Freq: Two times a day (BID) | RESPIRATORY_TRACT | 0 refills | Status: AC
Start: 2018-12-01 — End: 2018-12-28

## 2018-12-01 MED ORDER — PANTOPRAZOLE SODIUM 40 MG PO TBEC
40 mg | ORAL_TABLET | Freq: Every day | ORAL | 0 refills | Status: AC
Start: 2018-12-01 — End: 2018-12-28

## 2018-12-01 NOTE — Progress Notes
TELEMEDICINE NOTE    DATE: 12/01/2018    CC: No chief complaint on file.        HISTORY:  Vanessa Jensen is a 33 y.o. female with history of asthma, bipolar disorder, alcohol abuse, eating disorder, substance abuse who presents cough, SOB    Pt was recently in ER 4/9 for alcohol and cannabis intoxication. Pt states she was dehydrated, threw up blood once  On admission pt reported cough and dyspnea for 2 months- she attributes to black mold  She was tx with 2L IVF  Alcohol level 122 and tox screen positive for cannabis  CXR- negative  CBC normal    Still coughing, but still feeling short of breath  Feeling achy, lethargic  Nasal congestion  Feeling some chest congestion and having chest pain  Was able to go for a walk this morning, but felt SOB with walking  Sweating at night- but per pt this is chronic  Smokes marijuana at night  Hasn't drank since last week  She's worried she has an ulcer  No further hematemesis. Epigastric pain worse with coffee or tomatoes    ASSESSMENT/PLAN:      1. Asthma with acute exacerbation, unspecified asthma severity, unspecified whether persistent  - Cont albuterol PRN  - fluticasone-salmeterol (WIXELA INHUB) 500-50 mcg/dose diskus; Inhale 1 puff two (2) times daily.  Dispense: 60 puff; Refill: 0    2. Advice Given About Covid-19 Virus Infection  - Signs and symptoms of COVID-19 reviewed with patient. Reviewed proper hygiene and social distancing to limit potential spread. Advised the patient that as of now, he/she does not meet criteria for testing of COVID-19. However, advised pt to call back if he/she develops worsening fever, shortness of breath or cough.  We agreed to treat possible asthma flare first. Pt was advised to isolate for 7 days AND until symptoms have resolved for 72 hours. Strict ER and return precautions reviewed with the patient. Advised pt that antibody testing is not yet available at [facility], but hopefully will be available soon. 3. Hematemesis, presence of nausea not specified  Suspect mallory weiss tear. HGb stable in ER 4/9. Pt is concerned about PUD. Will start pantoprazole, advised to stop zantac due to recall. Counseled pt to abstain from alcohol and recreational drugs.   - pantoprazole 40 mg DR tablet; Take 1 tablet (40 mg total) by mouth daily.  Dispense: 30 tablet; Refill: 0    Pt advised to follow up with Dr. Theodoro Grist in a week    I spent a total of 25 minutes on this telemedicine visit, more than 50% was spent counseling.  Topics of our visit are documented as above. I verbally consented this patient for this telemedicine encounter.        Alessandra Bevels. Dorna Bloom, MD  12/01/2018 at 8:54 AM

## 2018-12-01 NOTE — Progress Notes
Patient Consent to Telehealth Questionnaire   [facility] TELEHEALTH PRECHECKIN QUESTIONS 12/01/2018   By clicking ''I Agree'', I consent to the below:  I Agree     - I agree  to be treated via a video visit and acknowledge that I may be liable for any relevant copays or coinsurance depending on my insurance plan.  - I understand that this video visit is offered for my convenience and I am able to cancel and reschedule for an in-person appointment if I desire.  - I also acknowledge that sensitive medical information may be discussed during this video visit appointment and that it is my responsibility to locate myself in a location that ensures privacy to my own level of comfort.  - I also acknowledge that I should not be participating in a video visit in a way that could cause danger to myself or to those around me (such as driving or walking).  If my provider is concerned about my safety, I understand that they have the right to terminate the visit.       [facility] VIDEO VISIT PROGRESS NOTE    PATIENT:  Vanessa Jensen   MRN:  [id number]  DOB:  Jul 22, 1986  DATE OF SERVICE:  12/01/2018  PCP: Linton Ham., MD    CHIEF COMPLAINT:No chief complaint on file.      HPI:  Vanessa Jensen is a 33 y.o. female with history of asthma, bipolar disorder, alcohol abuse, eating disorder, substance abuse who presents cough, SOB    Pt was recently in ER 4/9 for alcohol and cannabis intoxication  On admission pt reported cough and dyspnea for 2 months- she attributes to black mold  She was tx with 2L IVF  Alcohol level 122 and tox screen positive for cannabis    PAST MEDICAL HISTORY:  Past Medical History:   Diagnosis Date   ??? Asthma    ??? Bipolar II disorder (HCC/RAF)    ??? Chronic fatigue, unspecified    ??? CIN III (cervical intraepithelial neoplasia III)     s/p LEEP 12/16   ??? Depression with anxiety     prior suicide attempt 2006 by substance overdose   ??? History of alcohol abuse     h/o alchohol withdrawls ??? History of eating disorder    ??? History of pancreatitis 2001   ??? History of substance abuse (HCC/RAF)    ??? HPV in female 2005   ??? Insomnia    ??? Migraines    ??? PTSD (post-traumatic stress disorder)    ??? Sjogren's syndrome (HCC/RAF)    ??? Urticaria         FAMILY HISTORY:  Reviewed, unchanged from prior    SOCIAL HISTORY:  Reviewed, unchanged from prior    MEDICATIONS:  Current Outpatient Medications   Medication Sig   ??? albuterol 90 mcg/act inhaler Inhale 2 puffs every six (6) hours as needed.   ??? baclofen 10 mg tablet Take 10 mg by mouth at bedtime.   ??? CETIRIZINE 5 mg tablet TAKE 1 TABLET (5 MG TOTAL) BY MOUTH DAILY AS NEEDED FOR ALLERGIES. (NOT COV)   ??? clonazePAM 0.5 mg tablet TAKE 1 TABLET BY MOUTH TWICE A DAY AS NEEDED   ??? ibuprofen 600 mg tablet Take 1 tablet (600 mg total) by mouth every six (6) hours as needed.   ??? Licorice, Glycyrrhiza glabra, (LICORICE PO) Take by mouth.   ??? lithium 300 mg capsule Take 300 mg by mouth two (2) times daily.   ???  Misc Natural Products (DANDELION ROOT PO) Take by mouth.   ??? norethindrone-ethinyl estradiol (MICROGESTIN 1/20) 1-20 mg-mcg tablet Take 1 tablet by mouth daily.   ??? UNABLE TO FIND Citrigen .     No current facility-administered medications for this visit.        ALLERGIES:  Clindamycin; Nitrofurantoin; Tea; Fluconazole; Metronidazole; and Sulfa antibiotics    REVIEW OF SYSTEMS:  [x]  Normal (describe if abnormal)  []  Not Obtained    []   General []   CV  []   MS    []   Eyes  []  GI  []  Skin    []  ENT  []   GU  []  Neuro    []  Imm/All  []  Endo  []  Heme/lymph    []   Resp   []  Psych        PHYSICAL EXAM:  Vitals signs and exam not performed due to video visit encounter      Lab Review:  (Reviewed by me)  Lab Results   Component Value Date    WBC 9.30 10/11/2018    HGB 12.4 10/11/2018    HCT 38.8 10/11/2018    PLT 292 10/11/2018     Lab Results   Component Value Date    NA 139 10/11/2018    K 4.3 10/11/2018    CL 102 10/11/2018    CO2 21 10/11/2018    BUN 13 10/11/2018 CREAT 0.73 10/11/2018    GLUCOSE 82 10/11/2018    CALCIUM 9.5 10/11/2018     No results found for: APTT, PT, INR  Lab Results   Component Value Date    ALT 15 10/11/2018    AST 23 10/11/2018    BILITOT 0.4 10/11/2018    ALKPHOS 42 10/11/2018    ALBUMIN 4.7 10/11/2018     Lab Results   Component Value Date    TSH 2.8 10/11/2018    HGBA1C 5.3 10/11/2018     No results found for: CHOL, CHOLHDL, CHOLDLCAL, CHOLDLQ, TRIGLY  Lab Results   Component Value Date    CKTOT 122 12/28/2017     Lab Results   Component Value Date    VITD25OH 40 12/28/2017           Assessment & Plan:   Vanessa Jensen is a 33 y.o. female is presenting with the following:    There are no diagnoses linked to this encounter.      Health Maintenance   Topic Date Due   ??? Influenza Vaccine (Season Ended) 11/12/2019 (Originally 04/19/2019)   ??? Cervical Ca Screening: HPV Testing  04/24/2023   ??? Cervical Ca Screening: PAP Smear  04/24/2023   ??? Tdap/Td Vaccine (2 - Td) 02/29/2024   ??? HIV Screening  Completed           RTC:     Author:  Alessandra Bevels. Snow Peoples, MD 12/01/2018 8:45 AM    I spent a total of {Time :20906} minutes face to face with the patient of which greater than 50% of that time was spent in counseling/coordination.  Topics of my discussion are in my note.

## 2018-12-04 DIAGNOSIS — Z113 Encounter for screening for infections with a predominantly sexual mode of transmission: Secondary | ICD-10-CM

## 2018-12-09 ENCOUNTER — Telehealth: Payer: BLUE CROSS/BLUE SHIELD

## 2018-12-09 ENCOUNTER — Ambulatory Visit: Payer: BLUE CROSS/BLUE SHIELD

## 2018-12-09 DIAGNOSIS — K219 Gastro-esophageal reflux disease without esophagitis: Secondary | ICD-10-CM

## 2018-12-09 DIAGNOSIS — J4530 Mild persistent asthma, uncomplicated: Secondary | ICD-10-CM

## 2018-12-09 DIAGNOSIS — R1010 Upper abdominal pain, unspecified: Secondary | ICD-10-CM

## 2018-12-09 DIAGNOSIS — G4700 Insomnia, unspecified: Secondary | ICD-10-CM

## 2018-12-09 DIAGNOSIS — R110 Nausea: Secondary | ICD-10-CM

## 2018-12-09 DIAGNOSIS — Z87898 Personal history of other specified conditions: Secondary | ICD-10-CM
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
## 2018-12-13 ENCOUNTER — Telehealth: Payer: BLUE CROSS/BLUE SHIELD | Attending: Gastroenterology

## 2018-12-13 ENCOUNTER — Ambulatory Visit: Payer: BLUE CROSS/BLUE SHIELD | Attending: Critical Care Medicine

## 2018-12-13 ENCOUNTER — Ambulatory Visit: Payer: BLUE CROSS/BLUE SHIELD

## 2018-12-13 ENCOUNTER — Telehealth: Payer: BLUE CROSS/BLUE SHIELD

## 2018-12-13 ENCOUNTER — Telehealth: Payer: BLUE CROSS/BLUE SHIELD | Attending: Critical Care Medicine

## 2018-12-13 ENCOUNTER — Ambulatory Visit: Payer: BLUE CROSS/BLUE SHIELD | Attending: Gastroenterology

## 2018-12-13 DIAGNOSIS — K920 Hematemesis: Secondary | ICD-10-CM

## 2018-12-13 DIAGNOSIS — R110 Nausea: Secondary | ICD-10-CM

## 2018-12-13 DIAGNOSIS — F99 Mental disorder, not otherwise specified: Secondary | ICD-10-CM

## 2018-12-13 DIAGNOSIS — R1013 Epigastric pain: Secondary | ICD-10-CM

## 2018-12-13 DIAGNOSIS — F5105 Insomnia due to other mental disorder: Secondary | ICD-10-CM
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
## 2018-12-13 NOTE — Telephone Encounter
PDL Call to Practice    Reason for Call: pt upset she's not able to sign in for video visit wants a telephone call instead  MD: Culjat    Appointment Related?  [x]  Yes  []  No     If yes; today  Date:   Time:    Call warm transferred to PDL: [x]  Yes  []  No    Call Received by Practice Representative: paola
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
## 2018-12-14 NOTE — Telephone Encounter
Spoke with the patient. Helped her begin the video visit.  Video Visit was Completed.

## 2018-12-16 ENCOUNTER — Telehealth: Payer: BLUE CROSS/BLUE SHIELD

## 2018-12-16 NOTE — Telephone Encounter
Called patient Vanessa Jensen on 12/15/2018, left voicemail that [facility] is open and seeing patients. Physicians are also offering video visits. Can call [phone] to schedule office visit with myself, video visit with Dr. Deno Etienne, or they can ask to speak to Dr.Chu or myself.    Patient's [facility] treatment team:  Acupuncturist: Barton Dubois, LAc  Physician: Dr. Corliss Marcus, MD

## 2018-12-27 MED ORDER — PANTOPRAZOLE SODIUM 40 MG PO TBEC
ORAL_TABLET | 0 refills | Status: AC
Start: 2018-12-27 — End: 2018-12-30

## 2018-12-27 MED ORDER — FLUTICASONE-SALMETEROL 500-50 MCG/DOSE IN AEPB
0 refills | Status: AC
Start: 2018-12-27 — End: 2019-01-31

## 2018-12-27 MED ORDER — CETIRIZINE HCL 5 MG PO TABS
ORAL_TABLET | 0 refills | Status: AC
Start: 2018-12-27 — End: 2019-01-24

## 2018-12-29 ENCOUNTER — Telehealth: Payer: BLUE CROSS/BLUE SHIELD | Attending: Gastroenterology

## 2018-12-29 DIAGNOSIS — K920 Hematemesis: Secondary | ICD-10-CM

## 2018-12-29 MED ORDER — PANTOPRAZOLE SODIUM 40 MG PO TBEC
ORAL_TABLET | 1 refills | Status: AC
Start: 2018-12-29 — End: ?
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
## 2018-12-30 ENCOUNTER — Ambulatory Visit: Payer: BLUE CROSS/BLUE SHIELD

## 2018-12-30 ENCOUNTER — Inpatient Hospital Stay: Payer: BLUE CROSS/BLUE SHIELD | Attending: Gastroenterology

## 2018-12-30 ENCOUNTER — Telehealth: Payer: BLUE CROSS/BLUE SHIELD

## 2018-12-30 DIAGNOSIS — Z01812 Encounter for preprocedural laboratory examination: Secondary | ICD-10-CM

## 2018-12-30 NOTE — Telephone Encounter
Reply by: Reece Packer    Called and LVM to assist with scheduling for EGD procedure. Will be sending a mychart message too.

## 2018-12-30 NOTE — Telephone Encounter
PDL Call to Practice    Reason for Call: pt returning [facility] call  MD:Dr Ather    Appointment Related?  []  Yes  []  No     If yes;  Date:  Time:    Call warm transferred to PDL: [x]  Yes  []  No    Call Received by Practice Representative:Sachiko

## 2018-12-30 NOTE — Telephone Encounter
MP[facility] PROCEDURE SCHEDULING REQUEST    HIT F2  Please schedule the patient for the following procedure:   UPPER ENDOSCOPY    Please schedule for the following date and time:  Wednesday, 5/27 at 12pm with Dr. Peggye Ley    Staff, please check off all the options applicable before routing the encounter:       [x]  Patient has been informed about the procedure date and time.     [x]  Patient has been provided check in instructions, to bring responsible adult to procedure.    [x]  Provided Prep Instructions, based on orders.     [x]  Reviewed if RX prep was prescribed if applicable to Preferred Pharmacy.      [x]  Ensured insurance turnaround STAT, or Routine were flagged accordingly.     []  If [facility] patient, offered Monday Conscious Sedation, if not willing to pay 200.00 for MAC.     []   Any other type of insurance (Except Medical), please inform patient, MAC is usually a non-covered service. $200.00 is the fee for the professional Anesthesia services.  Conscious Sedation is included in their procedure, but not offered at [facility], or [facility].   Only at [employer] locations, such as [address], [place], [place].         HIT F2  ??? Patient has been informed about the $200.00 OUT OF POCKET  expense for MAC and patient has agreed to : SIGN ABN UPON CHECK IN AND GET BILLED    [facility] if the slot is booked prior to scheduling request being fulfilled, please reach out to the patient to coordinate new procedure date and time.       Reminder to all Clinic Staff???If you are coordinating specific procedure time and date with patient and MD, it is your responsibility to confirm these details with the patient. If MPU schedulers are communicating directly with the patient it is their responsibility to confirm the appointment.              MAC SCRIPT  As you may be aware, there are two sedation options that are used for colonoscopy and upper gastrointestinal endoscopy. One form of sedation consists of a combination of medicines (Versed and Fentanyl) administered by a registered nurse (RN) under the supervision of your gastroenterologist.  This form of sedation is included in your gastroenterologist???s fee.   The other form of sedation is ???intravenous sedation??? with the use of Propofol (sedative) administered by a specially trained physician called an anesthesiologist, also known as MAC (Monitored Anesthesia Care). Propofol has a rapid onset and short duration, compared to Versed and Fentanyl, and provides complete sedation, whereas with conscious sedation this is not guaranteed. Propofol also has potent anti-nausea properties, so one tends to awake quickly and without any ill effects.               Effective July 1st, 2017 the [facility] Medical Procedure unit in [facility] and [place] will only perform cases with Monitored Anesthesia Care (Propofol) for endoscopy procedures.   This type of sedation is not always covered by health plans. We will submit an authorization request if necessary for this type of sedation, but approval will be based on the medical guideline for that insurance carrier, and  there are limited medical conditions that would be considered appropriate to cover an anesthesiologist which include ???severe systematic diseases??? such as significant heart and/or lung diseases, sever insulin dependent diabetes, sleep apnea, and morbid obesity. Other  conditions that may require an anesthesiologist include a history of drug or alcohol abuse, chronic use of Valium type medications such as Ativan, Xanax, or Clonopin, or a prior documented history of previous problems with conscious sedations (Versed and Fentanyl) or anesthesia.  Please refer to your health plan medical guideline for specifics on their anesthesia coverage determination.

## 2018-12-30 NOTE — Telephone Encounter
Called and LVM to assist with scheduling for EGD procedure. Please transfer over to scheduler to assist.

## 2018-12-30 NOTE — Telephone Encounter
Reply by: Nitish Roes Victoria Zaylin Runco  Pt scheduled

## 2019-01-01 ENCOUNTER — Ambulatory Visit: Payer: BLUE CROSS/BLUE SHIELD

## 2019-01-04 ENCOUNTER — Telehealth: Payer: BLUE CROSS/BLUE SHIELD

## 2019-01-04 NOTE — Telephone Encounter
Confirmation Documentation   (confirmed) procedure(s)   Date: 01/12/19  Time: 12PM  Check-in Time: 11AM    Colonoscopy     Endoscopy  X   Bravo    Cath Placement    Sigmoidoscopy     Small Bowel Entero.    Esophageal Manometry    Anorectal Manometry    Biofeedback    pH study    Capsule Endoscopy    Secretin Infusion Test    Sham Feeding Study        Informed pt:  1. Of Arrival time (time varies based on location)? Y  2. Of location and room number? Y  3. Make sure there is a confirmed ride for the procedure. Y  4. Has procedure been authorized? Y  5. Was MAC Script provided?  Y  6. Reviewed Prep instructions with patient? Y/N? Y  7. Informed patient to call back with any questions and provided number.Y/N? Y    NOTE: If patients have any questions regarding prescribed medication patient needs to contact their prescribing physician to ensure it is ok to stop medications.      Comments/Notes:        If VM is left and pt calls back, please warm transfer patient to [phone]. This number is for internal use only and is not to be provided to patients.

## 2019-01-10 ENCOUNTER — Ambulatory Visit: Payer: BLUE CROSS/BLUE SHIELD

## 2019-01-17 ENCOUNTER — Institutional Professional Consult (permissible substitution): Payer: BLUE CROSS/BLUE SHIELD

## 2019-01-17 DIAGNOSIS — Z01812 Encounter for preprocedural laboratory examination: Secondary | ICD-10-CM

## 2019-01-18 MED ORDER — ALBUTEROL SULFATE HFA 108 (90 BASE) MCG/ACT IN AERS
2 refills | 25.00000 days | Status: AC
Start: 2019-01-18 — End: ?

## 2019-01-19 ENCOUNTER — Telehealth: Payer: BLUE CROSS/BLUE SHIELD | Attending: Gastroenterology

## 2019-01-19 DIAGNOSIS — R1013 Epigastric pain: Secondary | ICD-10-CM

## 2019-01-19 MED ORDER — PANTOPRAZOLE SODIUM 20 MG PO TBEC
40 mg | ORAL_TABLET | Freq: Every day | ORAL | 0 refills | Status: AC
Start: 2019-01-19 — End: 2019-01-19

## 2019-01-19 MED ORDER — PANTOPRAZOLE SODIUM 20 MG PO TBEC
40 mg | ORAL_TABLET | Freq: Every day | ORAL | 0 refills | Status: AC
Start: 2019-01-19 — End: ?
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
## 2019-01-23 MED ORDER — CETIRIZINE HCL 5 MG PO TABS
ORAL_TABLET | 0 refills
Start: 2019-01-23 — End: ?

## 2019-01-24 ENCOUNTER — Ambulatory Visit: Payer: BLUE CROSS/BLUE SHIELD

## 2019-01-24 MED ORDER — CETIRIZINE HCL 5 MG PO TABS
ORAL_TABLET | 0 refills | Status: AC
Start: 2019-01-24 — End: ?

## 2019-01-29 MED ORDER — WIXELA INHUB 500-50 MCG/DOSE IN AEPB
0 refills
Start: 2019-01-29 — End: ?

## 2019-01-31 MED ORDER — FLUTICASONE-SALMETEROL 500-50 MCG/DOSE IN AEPB
1 | Freq: Two times a day (BID) | RESPIRATORY_TRACT | 5 refills | Status: AC
Start: 2019-01-31 — End: ?

## 2019-02-08 ENCOUNTER — Ambulatory Visit: Payer: BLUE CROSS/BLUE SHIELD

## 2019-02-10 ENCOUNTER — Ambulatory Visit: Payer: BLUE CROSS/BLUE SHIELD

## 2019-02-10 ENCOUNTER — Telehealth: Payer: BLUE CROSS/BLUE SHIELD

## 2019-02-10 NOTE — Progress Notes
RETURN PATIENT TELEPHONE VISIT. VIDEO fail  Provider has determined an electronic visit is appropriate and safe due to the COVID19 crisis    PATIENT:  Vanessa Jensen  MRN:  [id number]  DOB:  [birth year]  DATE OF SERVICE:  02/10/2019    PRIMARY CARE PROVIDER: Linton Ham., MD    Pt verbally consented to have a telephone encounter in lieu of an in person appt    CC: lithium and lab follow ups  Subjective:   HPI: 33yo w/ phx as below who presents to discuss lithium and lab follow ups.   Pt messaged on mychart on 02/08/2019:  ''I have been seeing a new psychiatrist since October of last year. He???s had me on Lithium. I was taking 300 g per day, then up to 450 mg and then now I???m at 600 mg per day. I know that I need bloodwork done and he has never ordered me any. I spoke with another, brand new psychiatrist yesterday and he highly suggested I email you to have my Lithium, Kidney and Thyroid checked. He does not prescribe Lithium himself, because they do not have a lab. He also suggested I ask you for a [facility] psychiatrist recommendation as I am dealing with a multitude of things - bipolar 2, anxiety, some ptsd, depression, etc. I have to go to the [facility] lab anyway, sometime soon, as I had requested an STD test from my OBGYN. So I could get it all done at once if you???d like to add those tests for the Lithium levels and kidney and thyroid count.  My current psychiatrist???s name is Dr. Waynetta Sandy / [facility] and the new one I spoke to yesterday is Dr. Silvestre Mesi / [facility].''    On 600mg  right now of lithium. No particular side effect with higher dose.      History:     Patient Active Problem List   Diagnosis   ??? Sjogren's syndrome (HCC/RAF)   ??? Bipolar II disorder (HCC/RAF)   ??? Anxiety   ??? Asthma   ??? Insomnia     Past Medical History:   Diagnosis Date   ??? Asthma    ??? Bipolar II disorder (HCC/RAF)    ??? Chronic fatigue, unspecified    ??? CIN III (cervical intraepithelial neoplasia III)     s/p LEEP 12/16 ??? Depression with anxiety     prior suicide attempt 2006 by substance overdose   ??? History of alcohol abuse     h/o alchohol withdrawls   ??? History of eating disorder    ??? History of pancreatitis 2001   ??? History of substance abuse (HCC/RAF)    ??? HPV in female 2005   ??? Insomnia    ??? Migraines    ??? PTSD (post-traumatic stress disorder)    ??? Sjogren's syndrome (HCC/RAF)    ??? Urticaria      Current Outpatient Medications   Medication Sig   ??? ALBUTEROL 90 mcg/act inhaler INHALE 2 PUFFS BY MOUTH EVERY 6 HOURS AS NEEDED   ??? baclofen 10 mg tablet Take 10 mg by mouth at bedtime.   ??? CETIRIZINE 5 mg tablet TAKE 1 TABLET (5 MG TOTAL) BY MOUTH DAILY AS NEEDED FOR ALLERGIES. (NOT COV)   ??? clonazePAM 0.5 mg tablet TAKE 1 TABLET BY MOUTH TWICE A DAY AS NEEDED   ??? fluticasone-salmeterol (WIXELA INHUB) 500-50 mcg/dose diskus Inhale 1 puff two (2) times daily.   ??? Licorice, Glycyrrhiza glabra, (LICORICE PO) Take by mouth.   ???  lithium 150 mg capsule 600 mg.   ??? lithium 300 mg capsule Take 300 mg by mouth two (2) times daily 450 mg total daily.   ??? Misc Natural Products (DANDELION ROOT PO) Take by mouth.   ??? multivitamin tablet Take by mouth daily.   ??? norethindrone-ethinyl estradiol (MICROGESTIN 1/20) 1-20 mg-mcg tablet Take 1 tablet by mouth daily. (Patient not taking: Reported on 12/13/2018.)   ??? pantoprazole 20 mg DR tablet Take 2 tablets (40 mg total) by mouth daily.   ??? pantoprazole 40 mg DR tablet TAKE 1 TABLET BY MOUTH EVERY DAY.   ??? UNABLE TO FIND Citrigen .     No current facility-administered medications for this visit.      Social History     Socioeconomic History   ??? Marital status: Single     Spouse name: Not on file   ??? Number of children: Not on file   ??? Years of education: Not on file   ??? Highest education level: Not on file   Occupational History   ??? Occupation: student     Comment: integrative nutrition, holistic   Social Needs   ??? Financial resource strain: Not on file   ??? Food insecurity:     Worry: Not on file Inability: Not on file   ??? Transportation needs:     Medical: Not on file     Non-medical: Not on file   Tobacco Use   ??? Smoking status: Current Every Day Smoker     Packs/day: 0.33     Years: 13.00     Pack years: 90.29     Start date: 02/04/2005   ??? Smokeless tobacco: Never Used   ??? Tobacco comment: 1/3 of a pack/day currently   Substance and Sexual Activity   ??? Alcohol use: Not Currently     Alcohol/week: 0.0 oz     Comment: a little bit of champagne during holidays since march 2019. In past was drinking 8-12 glasses of wine per week.    ??? Drug use: Yes     Types: Marijuana     Comment: marijuana nightly   ??? Sexual activity: Yes     Partners: Male     Birth control/protection: Condom     Comment: condoms consistently. single partner.    Lifestyle   ??? Physical activity:     Days per week: Not on file     Minutes per session: Not on file   ??? Stress: Not on file   Relationships   ??? Social connections:     Talks on phone: Not on file     Gets together: Not on file     Attends religious service: Not on file     Active member of club or organization: Not on file     Attends meetings of clubs or organizations: Not on file     Relationship status: Not on file   Other Topics Concern   ??? Not on file   Social History Narrative   ??? Not on file     Family History   Problem Relation Age of Onset   ??? Crohn's disease Sister         half sister   ??? Arthritis Mother    ??? Bipolar disorder Mother         bipolar II   ??? Asthma Father    ??? Depression Father    ??? Hypertension Father    ??? Hyperlipidemia Father    ???  Stroke Father    ??? Substance Abuse Father    ??? Bipolar disorder Father         bipolar 1   ??? Alcohol abuse Maternal Grandmother    ??? Cancer Maternal Grandmother    ??? Alcohol abuse Maternal Grandfather    ??? Cancer Maternal Grandfather    ??? Alcohol abuse Paternal Grandfather    ??? Cancer Paternal Grandfather      Review of Systems:   Per HPI    Objective:   Physical Exam:       Wt Readings from Last 3 Encounters: 01/19/19 109 lb (49.4 kg)   12/13/18 109 lb (49.4 kg)   10/12/18 112 lb (50.8 kg)     There is no height or weight on file to calculate BMI.    System Check if normal Positive or additional negative findings   Constit  [x]  General sound Well sounding   Eyes  []  Conj/Lids []  Pupils  []  Fundi     HENMT  []  External ears/nose []  Otoscopy   []  gross Hearing []  Nasal mucosa   []  Lips/teeth/gums []  Oropharynx    []  mucus membranes     Neck  []  Inspection/palpation []  Thyroid     Resp  []  Normal effort []  No Wheezing    []  Auscultation  [] No Crackles speaking in full sentences without  issue   CV  []  Rhythm/rate   []  Murmurs   []  LEE   []  JVP non-elevated    Normal pulses:   []  radial []  Femoral  []  Pedal     Breast  []  Inspection []  Palpation     GI  []  abd masses    []  tenderness   []  rebound/guarding   []  Liver/spleen []  Rectal     GU  M: []  Scrotum []  Penis []  Prostate   F:  []  External []  vaginal wall        []  Cervix  []  mucus        []  Uterus    []  Adnexa      Lymph  []  Neck []  Axillae []  Groin     MSK Specify site examined:    []  Inspect/palp []  ROM   []  Stability []  Strength/tone         Skin  []  Inspection []  Palpation     Neuro  []  CN2-12 intact grossly   [x]  Alert and oriented   []  DTR      []  Muscle strength      []  Sensation   []  Gait/balance     Psych  []  Insight/judgement     []  Mood/affect    [x]  Gross cognition  anxious        I have:    []  Reviewed/ordered radiology  []  Independently interpreted studies    []  Reviewed/ordered labs  []  Reviewed & summarized old records    []  Reviewed/ordered diag med test   []  Decided to get outside medical records      []  Obtained history from someone other than patient    Labs/Studies:    Lab Results   Component Value Date    WBC 9.30 10/11/2018    HGB 12.4 10/11/2018    HCT 38.8 10/11/2018    MCV 93.3 10/11/2018    PLT 292 10/11/2018     Lab Results   Component Value Date    NA 139 10/11/2018    K 4.3 10/11/2018    CL 102 10/11/2018    CO2  21 10/11/2018 CREAT 0.73 10/11/2018    BUN 13 10/11/2018     Lab Results   Component Value Date    ALT 15 10/11/2018    AST 23 10/11/2018    ALKPHOS 42 10/11/2018    BILITOT 0.4 10/11/2018     Lab Results   Component Value Date    CALCIUM 9.5 10/11/2018     Lab Results   Component Value Date    TSH 2.8 10/11/2018    T4AUTO 1.1 12/28/2017     Lab Results   Component Value Date    HGBA1C 5.3 10/11/2018       Assessment & Plan:   33yo w/ phx as above who presents to discuss lithium and lab follow ups.     #bipolar II disorder  Recent increase in lithium from 450 to 600 and wants to get labs check as current psychiatrist unable to order for her.  -lithium level  -cbc, bmp, lft, TSH/ft4  -numbers to psychiatrist sent on mychart.    I spent 12 minutes counseling and coordinating care for the above medical issue via telephone with the consent of the patient.     Future Appointments   Date Time Provider Department Center   02/10/2019 10:15 AM Curtis Uriarte, Isaac Laud., MD [facility] MEDICINE   02/14/2019  2:15 PM Ather, Robin Searing., MD [facility]       Author:  Linton Ham, MD 02/10/2019 7:34 AM    The above plan of care, diagnosis, order, and follow-up were discussed with the patient. Questions related to this recommended plan of care were answered

## 2019-02-14 ENCOUNTER — Telehealth: Payer: BLUE CROSS/BLUE SHIELD

## 2019-02-14 ENCOUNTER — Ambulatory Visit: Payer: BLUE CROSS/BLUE SHIELD | Attending: Gastroenterology

## 2019-02-14 NOTE — Telephone Encounter
Called patient at 2:09 pm for pre-check in prior to video visit at 2:30 pm with Dr. Dia Sitter, no answer; left detailed message regarding this matter.

## 2019-02-16 ENCOUNTER — Telehealth: Payer: BLUE CROSS/BLUE SHIELD | Attending: Gastroenterology

## 2019-02-16 ENCOUNTER — Ambulatory Visit: Payer: BLUE CROSS/BLUE SHIELD | Attending: Gastroenterology

## 2019-02-16 DIAGNOSIS — K219 Gastro-esophageal reflux disease without esophagitis: Secondary | ICD-10-CM

## 2019-02-16 MED ORDER — FAMOTIDINE 40 MG PO TABS
40 mg | ORAL_TABLET | ORAL | 1 refills | Status: AC | PRN
Start: 2019-02-16 — End: ?
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
## 2019-02-28 ENCOUNTER — Ambulatory Visit: Payer: BLUE CROSS/BLUE SHIELD

## 2019-03-16 NOTE — Consults
Duplicate note, erroneous.

## 2019-04-20 ENCOUNTER — Telehealth: Payer: BLUE CROSS/BLUE SHIELD

## 2019-04-20 ENCOUNTER — Ambulatory Visit: Payer: BLUE CROSS/BLUE SHIELD | Attending: Gastroenterology

## 2019-04-20 NOTE — Telephone Encounter
Reply by: Edgar Frisk  Given acute concerning symptoms it would be difficult to evaluate in clinic. Pt should be re-evaluated by ER.

## 2019-04-20 NOTE — Telephone Encounter
Patient called stating the following:    -abdominal above belly button  -pain radiates to chest  -pain level 8/10  -allergic to alcohol  -drank alcohol 4 days straight  - vomiting blood   -called 911 last and taken to E.R ([facility])  -took 1/2 a bottle of CLONOPIN 0.8 MG  -took 1/2 bottle of BACLOFEN  -thinks she might have a GI Bleed  -asthma attack 3 weeks ago    States she already spoke with her therapist this morning and has an appointment scheduled.  Patient does have an appointment today at 4:30pm.  Would like to know if she is should come in or go to the E.R again.    Patient is aware provider is doing procedures and will not retrun till late in day.

## 2019-04-20 NOTE — Telephone Encounter
PDL Call to Practice    Reason for Call: patient is in sever pain level 8  Pt was seen in ER yesterday and was advice to contact GI MD.  Pt states a scope is needed   MD:    Appointment Related?  []  Yes  []  No     If yes;  Date:  Time:    Call warm transferred to PDL: []  Yes  []  No    Call Received by Practice Representative:

## 2019-04-20 NOTE — Telephone Encounter
Called patient to inform her of the following from Dr. Dia Sitter:    ''Given acute concerning symptoms it would be difficult to evaluate in clinic. Pt should be re-evaluated by ER''.     Per patient ok with it.  Patient stated pain did subside a little due to Pepcid, but has not eaten anything yet.  Patient sates she will go ahead and take a shower then proceed to nearest emergency room.

## 2019-04-25 ENCOUNTER — Ambulatory Visit: Payer: BLUE CROSS/BLUE SHIELD

## 2019-04-26 ENCOUNTER — Telehealth: Payer: PRIVATE HEALTH INSURANCE

## 2019-04-26 ENCOUNTER — Telehealth: Payer: BLUE CROSS/BLUE SHIELD

## 2019-04-26 ENCOUNTER — Institutional Professional Consult (permissible substitution): Payer: BLUE CROSS/BLUE SHIELD

## 2019-04-26 ENCOUNTER — Ambulatory Visit: Payer: PRIVATE HEALTH INSURANCE

## 2019-04-26 ENCOUNTER — Inpatient Hospital Stay: Payer: PRIVATE HEALTH INSURANCE | Attending: Gastroenterology

## 2019-04-26 DIAGNOSIS — R6889 Other general symptoms and signs: Secondary | ICD-10-CM

## 2019-04-26 NOTE — Telephone Encounter
Good afternoon,    Please review auth for urgent procedure scheduled with [facility] on 9/10.    Thank you!

## 2019-04-26 NOTE — Telephone Encounter
MAC Script     MD: Ather    Patient has been advised of method of anesthesia that will be used in this procedure.  If MAC used, MAC script has been advised.  If IVCS used, pt given option of MAC.     _0  Pt has been offered all options and is requesting to be scheduled with [facility]. Patient will pay $200 if authorization is denied.    _1  Pt has been scheduled in [place] on sedation day (if insurance applicable).      _2  Patient has requested to be scheduled with conscious sedation in one of our community Medical procedure units.      Prep instructions have been delivered to patient via:      _3  Email to __________  _4  Fax to ____________  _5  Mail to Address on file  _6  Sent via letter on Mychart  _7  Preps provided by the office

## 2019-04-26 NOTE — Progress Notes
RETURN PATIENT TELEPHONE VISIT. VIDEO fail  Provider has determined an electronic visit is appropriate and safe due to the COVID19 crisis    PATIENT:  Vanessa Jensen  MRN:  [id number]  DOB:  1986/02/24  DATE OF SERVICE:  04/26/2019    PRIMARY CARE PROVIDER: Linton Ham., MD    Pt verbally consented to have a telephone encounter in lieu of an in person appt    CC: myalgia, fatigue  Subjective:   HPI: 33yo w/ phx of asthma, migraine headaches, sjogren's, bipolar II disorder, hx of alcohol abuse who presents today to discuss ongoing myalgia fatigue.    Was in [place] Tes[place] night[place]wed morning last week. Relapse of alcoholism/drinking a lot of sake(about 6-8bottles) vomitted blood. Hit her toe and eye. Xray done and negative. Given fluids. Pt was told to get GI scope and had been in touch w/ Dr. Standley Dakins).  Didn't get that much pain overnight on Wednesday. Thursday abdominal pain.   Dr. Peggye Ley had advised she got back to ER but pt had not. She took protonix and felt better briefly as for reason why.  Was feeling better past few days until last night ate too much and stomach pain 5/10 at lower left.  [phone] tested there at ER was negative.  Notes when she usually stops alcohol after a binge she tend to feel like she has fever. Nothing her hx of sjogren.  Sweating real bad at night which has since resolved. Occurred Thursday and Friday night.  Has runny nose, headache, extreme fatigue currently.  Zyrtec--helped with nasal congestion/throat pain.  B/l leg swelling knee down that started but she has had something similar in the past when she comes off alcohol. Leg swelling intermittent has been happening since age 66-12. Swelling currently down. Sensitive to salt. No pain/redness.   Brother w/ clotting disorder per pt.  Family hx of vascular disease. Grandpa vascular disease? But not sure of details.  Myalgia, fatigue worse past few days.. Mild sore throat, mild headache now.  No thermometer. Had a breakup for why she started drinking alcohol again.    History:     Patient Active Problem List   Diagnosis   ? Sjogren's syndrome (HCC/RAF)   ? Bipolar II disorder (HCC/RAF)   ? Anxiety   ? Asthma   ? Insomnia     Past Medical History:   Diagnosis Date   ? Asthma    ? Bipolar II disorder (HCC/RAF)    ? Chronic fatigue, unspecified    ? CIN III (cervical intraepithelial neoplasia III)     s/p LEEP 12/16   ? Depression with anxiety     prior suicide attempt 2006 by substance overdose   ? History of alcohol abuse     h/o alchohol withdrawls   ? History of eating disorder    ? History of pancreatitis 2001   ? History of substance abuse (HCC/RAF)    ? HPV in female 2005   ? Insomnia    ? Migraines    ? PTSD (post-traumatic stress disorder)    ? Sjogren's syndrome (HCC/RAF)    ? Urticaria      Current Outpatient Medications   Medication Sig   ? ALBUTEROL 90 mcg/act inhaler INHALE 2 PUFFS BY MOUTH EVERY 6 HOURS AS NEEDED   ? baclofen 10 mg tablet Take 10 mg by mouth at bedtime.   ? CETIRIZINE 5 mg tablet TAKE 1 TABLET (5 MG TOTAL) BY MOUTH DAILY AS NEEDED FOR ALLERGIES. (NOT COV)   ?  clonazePAM 0.5 mg tablet Take 1 tablet (0.5 mg total) by mouth two (2) times daily. Max Daily Amount: 1 mg   ? famotidine 40 mg tablet Take 1 tablet (40 mg total) by mouth as needed for for Heartburn.   ? fluticasone-salmeterol (WIXELA INHUB) 500-50 mcg/dose diskus Inhale 1 puff two (2) times daily.   ? Licorice, Glycyrrhiza glabra, (LICORICE PO) Take by mouth.   ? Misc Natural Products (DANDELION ROOT PO) Take by mouth.   ? multivitamin tablet Take by mouth daily.   ? pantoprazole 40 mg DR tablet TAKE 1 TABLET BY MOUTH EVERY DAY.   ? UNABLE TO FIND Citrigen .   ? norethindrone-ethinyl estradiol (MICROGESTIN 1/20) 1-20 mg-mcg tablet Take 1 tablet by mouth daily. (Patient not taking: Reported on 12/13/2018.)     No current facility-administered medications for this visit.      Social History     Socioeconomic History ? Marital status: Single     Spouse name: Not on file   ? Number of children: Not on file   ? Years of education: Not on file   ? Highest education level: Not on file   Occupational History   ? Occupation: student     Comment: integrative nutrition, holistic   Social Needs   ? Financial resource strain: Not on file   ? Food insecurity:     Worry: Not on file     Inability: Not on file   ? Transportation needs:     Medical: Not on file     Non-medical: Not on file   Tobacco Use   ? Smoking status: Current Every Day Smoker     Packs/day: 0.33     Years: 13.00     Pack years: 22.29     Start date: 02/04/2005   ? Smokeless tobacco: Never Used   ? Tobacco comment: 1/3 of a pack/day currently   Substance and Sexual Activity   ? Alcohol use: Not Currently     Alcohol/week: 0.0 oz     Comment: a little bit of champagne during holidays since march 2019. In past was drinking 8-12 glasses of wine per week.    ? Drug use: Yes     Types: Marijuana     Comment: marijuana nightly   ? Sexual activity: Yes     Partners: Male     Birth control/protection: Condom     Comment: condoms consistently. single partner.    Lifestyle   ? Physical activity:     Days per week: Not on file     Minutes per session: Not on file   ? Stress: Not on file   Relationships   ? Social connections:     Talks on phone: Not on file     Gets together: Not on file     Attends religious service: Not on file     Active member of club or organization: Not on file     Attends meetings of clubs or organizations: Not on file     Relationship status: Not on file   Other Topics Concern   ? Not on file   Social History Narrative   ? Not on file     Family History   Problem Relation Age of Onset   ? Crohn's disease Sister         half sister   ? Arthritis Mother    ? Bipolar disorder Mother         bipolar II   ?  Asthma Father    ? Depression Father    ? Hypertension Father    ? Hyperlipidemia Father    ? Stroke Father    ? Substance Abuse Father ? Bipolar disorder Father         bipolar 1   ? Alcohol abuse Maternal Grandmother    ? Cancer Maternal Grandmother    ? Alcohol abuse Maternal Grandfather    ? Cancer Maternal Grandfather    ? Alcohol abuse Paternal Grandfather    ? Cancer Paternal Grandfather      Review of Systems:   Per HPI    Objective:   Physical Exam:       Wt Readings from Last 3 Encounters:   02/16/19 112 lb (50.8 kg)   01/19/19 109 lb (49.4 kg)   12/13/18 109 lb (49.4 kg)     There is no height or weight on file to calculate BMI.    System Check if normal Positive or additional negative findings   Constit  [x]  General sound Well sounding   Eyes  []  Conj/Lids []  Pupils  []  Fundi     HENMT  []  External ears/nose []  Otoscopy   []  gross Hearing []  Nasal mucosa   []  Lips/teeth/gums []  Oropharynx    []  mucus membranes     Neck  []  Inspection/palpation []  Thyroid     Resp  []  Normal effort []  No Wheezing    []  Auscultation  [] No Crackles speaking in full sentences without issue   CV  []  Rhythm/rate   []  Murmurs   []  LEE   []  JVP non-elevated    Normal pulses:   []  radial []  Femoral  []  Pedal     Breast  []  Inspection []  Palpation     GI  []  abd masses    []  tenderness   []  rebound/guarding   []  Liver/spleen []  Rectal     GU  M: []  Scrotum []  Penis []  Prostate   F:  []  External []  vaginal wall        []  Cervix  []  mucus        []  Uterus    []  Adnexa      Lymph  []  Neck []  Axillae []  Groin     MSK Specify site examined:    []  Inspect/palp []  ROM   []  Stability []  Strength/tone         Skin  []  Inspection []  Palpation     Neuro  []  CN2-12 intact grossly   [x]  Alert and oriented   []  DTR      []  Muscle strength      []  Sensation   []  Gait/balance     Psych  []  Insight/judgement     []  Mood/affect    [x]  Gross cognition  anxious      I have:    []  Reviewed/ordered radiology  []  Independently interpreted studies    []  Reviewed/ordered labs  []  Reviewed & summarized old records    []  Reviewed/ordered diag med test   []  Decided to get outside medical records      []  Obtained history from someone other than patient    Labs/Studies:    Lab Results   Component Value Date    WBC 9.30 10/11/2018    HGB 12.4 10/11/2018    HCT 38.8 10/11/2018    MCV 93.3 10/11/2018    PLT 292 10/11/2018     Lab Results   Component Value Date  NA 139 10/11/2018    K 4.3 10/11/2018    CL 102 10/11/2018    CO2 21 10/11/2018    CREAT 0.73 10/11/2018    BUN 13 10/11/2018     Lab Results   Component Value Date    ALT 15 10/11/2018    AST 23 10/11/2018    ALKPHOS 42 10/11/2018    BILITOT 0.4 10/11/2018     Lab Results   Component Value Date    CALCIUM 9.5 10/11/2018     Lab Results   Component Value Date    TSH 2.8 10/11/2018    T4AUTO 1.1 12/28/2017     Lab Results   Component Value Date    HGBA1C 5.3 10/11/2018       Assessment & Plan:   33yo w/ phx of asthma, migraine headaches, sjogren's, bipolar II disorder, hx of alcohol abuse who presents today to discuss ongoing myalgia fatigue.    #suspect [phone] infection  #flu-like symptoms  Pt w/ hx of asthma, sjogren's and thus will order covid19 PCR. Had negative PCR on Wednesday but may have been tested early and not detected.  -[id number] PCR    #abdominal pain  #hematemesis  -cont protonix  -f/u w/ Dr. Peggye Ley; pt to see this morning.    No future appointments.    Author:  Linton Ham, MD 04/26/2019 8:39 AM    The above plan of care, diagnosis, order, and follow-up were discussed with the patient. Questions related to this recommended plan of care were answered

## 2019-04-27 ENCOUNTER — Telehealth: Payer: BLUE CROSS/BLUE SHIELD

## 2019-04-27 LAB — COVID-19 PCR: COVID-19 PCR: NOT DETECTED

## 2019-04-27 NOTE — Telephone Encounter
-----   Message from [place], RN sent at 04/27/2019  8:06 AM PDT -----  Regarding: Change of Venue  Good morning,    Please see Dr. Julianne Rice message below:    Sorry this is a bit late notice! I think this patient was added on and so I did not see her record until now.  Patient is Vanessa Jensen, Vanessa Jensen (MRN [id number]) scheduled for an EGD with Dr. Dia Sitter on 04/28/19 Tues.  She has hx of active etoh abuse  (recently hospitalized after etoh binge last week); along with hx of substantial cannabis use; also has hx of prior opioid/ cocaine, acid, DMT, PCP, mushrooms, and ketamine in past.  I think it would be safer to have patient done in hospital setting for increased monitoring and also to have mechanical ventilation on standby if needed given her increased anesthetic tolerance.  Please let me know if any issues! Thank you very much!    Sincerely,  Drucie Opitz     Thank you,    [place] [facility]  [phone]

## 2019-04-27 NOTE — Telephone Encounter
Spoke to pt and moved procedure to [facility] with Dr. Scarlette Ar on 9/10.    Thank you!

## 2019-04-27 NOTE — Telephone Encounter
Good morning,    Per anesthesiologist:

## 2019-04-28 MED ADMIN — LIDOCAINE HCL (CARDIAC) PF 100 MG/5ML IV SOSY: INTRAVENOUS | @ 21:00:00 | Stop: 2019-04-28 | NDC 00409132305

## 2019-04-28 MED ADMIN — SODIUM CHLORIDE 0.9 % IV SOLN: INTRAVENOUS | @ 21:00:00 | Stop: 2019-04-28 | NDC 00338004904

## 2019-04-28 MED ADMIN — PROPOFOL 200 MG/20ML IV EMUL: @ 21:00:00 | Stop: 2019-04-28 | NDC 63323026929

## 2019-04-28 MED ADMIN — PROPOFOL 200 MG/20ML IV EMUL: @ 21:00:00 | Stop: 2019-04-28

## 2019-04-28 MED ADMIN — GLYCOPYRROLATE 1 MG/5ML IJ SOLN: @ 21:00:00 | Stop: 2019-04-28 | NDC 70121139605

## 2019-04-28 NOTE — H&P
Procedure: [x]  EGD  []  Colonoscopy  []  PEG  []  Enteroscopy  []  Flexible Sigmoidoscopy                     []  Other:    Level of sedation intended for procedure: []  Moderate  [x]  MAC  []  Anesthesia    Informed Consent Obtained Including Risks, Benefits & Alternatives:  [x]  Yes    Informed Consent Obtained For Sedation Including Risks, Benefits & Alternatives: [x]  Yes    History:    Chief Complaint / Indication:  Dyspepsia    Allergies:   Allergies   Allergen Reactions   ? Clindamycin    ? Nitrofurantoin    ? Tea      Green tea   ? Fluconazole Hives   ? Metronidazole Hives   ? Sulfa Antibiotics Hives       Current Medication: Medication record reviewed  Medications that the patient states to be currently taking   Medication Sig   ? ALBUTEROL 90 mcg/act inhaler INHALE 2 PUFFS BY MOUTH EVERY 6 HOURS AS NEEDED   ? baclofen 10 mg tablet Take 10 mg by mouth at bedtime.   ? CETIRIZINE 5 mg tablet TAKE 1 TABLET (5 MG TOTAL) BY MOUTH DAILY AS NEEDED FOR ALLERGIES. (NOT COV)   ? clonazePAM 0.5 mg tablet Take 1 tablet (0.5 mg total) by mouth two (2) times daily. Max Daily Amount: 1 mg   ? famotidine 40 mg tablet Take 1 tablet (40 mg total) by mouth as needed for for Heartburn.   ? pantoprazole 40 mg DR tablet TAKE 1 TABLET BY MOUTH EVERY DAY.       Past Medical History:   Past Medical History:   Diagnosis Date   ? Asthma    ? Bipolar II disorder (HCC/RAF)    ? Chronic fatigue, unspecified    ? CIN III (cervical intraepithelial neoplasia III)     s/p LEEP 12/16   ? Depression with anxiety     prior suicide attempt 2006 by substance overdose   ? History of alcohol abuse     h/o alchohol withdrawls   ? History of eating disorder    ? History of pancreatitis 2001   ? History of substance abuse (HCC/RAF)    ? HPV in female 2005   ? Insomnia    ? Migraines    ? PTSD (post-traumatic stress disorder)    ? Sjogren's syndrome (HCC/RAF)    ? Urticaria         Family History: family history includes Alcohol abuse in her maternal grandfather, maternal grandmother, and paternal grandfather; Arthritis in her mother; Asthma in her father; Bipolar disorder in her father and mother; Cancer in her maternal grandfather, maternal grandmother, and paternal grandfather; Crohn's disease in her sister; Depression in her father; Hyperlipidemia in her father; Hypertension in her father; Stroke in her father; Substance Abuse in her father.    Social History:  reports that she has been smoking. She started smoking about 14 years ago. She has a 4.29 pack-year smoking history. She has never used smokeless tobacco. She reports previous alcohol use. She reports current drug use. Drug: Marijuana.    Past Surgical History:   Past Surgical History:   Procedure Laterality Date   ? CERVICAL BIOPSY  W/ LOOP ELECTRODE EXCISION  2006, 07/19/2015   ? DILATION AND CURETTAGE OF UTERUS  08/2013   ? HX COLPOSCOPY OF THE CERVIX  08/2014   ? TOE SURGERY  08/2014    broken toe with torn ligaments in 08/2014 and dislocated ulna/wrist tear in 03/2015?    ? WRIST SURGERY  05/2016    dislocated ulna, torn tendon        Physical Exam:    Vitals Signs: There were no vitals filed for this visit.  There is no height or weight on file to calculate BMI.    Normal Exam                                     Additional Findings  [x]  General  [x]  HEENT  [x]  Heart / CVS  [x]  Lungs / Respiratory  [x]  Abdomen      Airway Assessment:    [place] Visualized: [x]  Yes  []  Partially  []  Not at all  Neck ROM: [x]  Normal  []  Limited  Sleep apnea confirmed by sleep study or on CPAP at home: [x]  No  []  Yes      ASA Classification: ASA 2 - Patient with mild systemic disease with no functional limitations    I have reviewed the history and physical and have determined Vanessa Jensen to be an appropriate candidate to undergo the planned procedure with sedation and analgesia.

## 2019-04-28 NOTE — Nursing Note
MAC anesthesia, please see anesthesia record for patient assessment, medication and vital signs information.

## 2019-04-28 NOTE — Procedures
PATIENT NAME:       Vanessa Jensen, Vanessa Jensen  DATE OF BIRTH:       11-20-1985  RECORD NUMBER:      [id number]  DATE/TIME OF PROCEDURE:     04/28/2019 / 02:00:00 PM  ENDOSCOPIST:       Rosezena Sensor, MD  REFERRING PHYSICIAN:        FELLOW:           INDICATIONS FOR EXAMINATION:      Dyspepsia               PROCEDURE PERFORMED:             UPPER GI ENDOSCOPY    MEDICATIONS:    MAC anesthesia    PROCEDURE TECHNIQUE:  Informed consent obtained for the procedure including risks, benefits and alternatives and the risk of those alternatives. Informed consent obtained for sedation including risks, benefits and alternatives and the risk of those alternatives.    EKG, pulse, pulse oximetry and blood pressure were monitored throughout the procedure.     The endoscope was passed with ease through the mouth under direct visualization; it was extended to the 2nd portion of the duodenum. The scope was withdrawn and the mucosa was carefully examined. The views were excellent. The patient's toleration of the   procedure was excellent.    TOTAL WITHDRAWL TIME:    TOTAL INSERTION TIME:      EXTENT OF EXAM:  2nd portion of the duodenum            INSTRUMENTS:   #[id number] (E-6)  TECHNICAL DIFFICULTY:  No   LIMITATIONS:      none  TOLERANCE:   Good  VISUALIZATION:  Good    FINDINGS:   Esophagus: The esophagus appeared to be normal.  The Z-line was seen at 37 cm.  Stomach: The stomach appeared to be normal.     Duodenum: The duodenum appeared to be normal.    ESTIMATED BLOOD LOSS:   None     DIAGNOSIS:  Normal esophagus.  Normal stomach.    Normal duodenum.    RECOMMENDATIONS:  Follow up with Dr. Dia Sitter          This electronic signature authenticates all electronic and/or handwritten documentation, including orders, generated by the signer during the episode of care contained in this record.  04/28/2019 02:22:12 PM By Rosezena Sensor MD

## 2019-04-28 NOTE — Progress Notes
1427: Received pt via gurney from GI lab.  Vital signs stable. Patient drowsy but comfortable,denies pain, n/v. Abdomen soft to touch. Will continue to monitor.   1457: Pt awake, a/o x4. Patient able to sit up, swallow liquids, and stand without difficulty. Discharge instructions provided; pt verbalized understanding. IV removed. Pt with belongings, to be d/c with friend. Accompanied to the lobby by transport via wheelchair.

## 2019-05-16 MED ORDER — ALBUTEROL SULFATE HFA 108 (90 BASE) MCG/ACT IN AERS
2 refills | 25.00000 days | Status: AC
Start: 2019-05-16 — End: ?

## 2019-05-27 ENCOUNTER — Ambulatory Visit: Payer: BLUE CROSS/BLUE SHIELD

## 2019-05-27 MED ORDER — CETIRIZINE HCL 5 MG PO TABS
5 mg | ORAL_TABLET | Freq: Every day | ORAL | 3 refills | Status: AC
Start: 2019-05-27 — End: ?

## 2019-05-27 MED ORDER — IBUPROFEN 600 MG PO TABS
600 mg | ORAL_TABLET | Freq: Three times a day (TID) | ORAL | 0 refills | Status: AC | PRN
Start: 2019-05-27 — End: ?

## 2019-05-31 ENCOUNTER — Ambulatory Visit: Payer: BLUE CROSS/BLUE SHIELD

## 2019-05-31 DIAGNOSIS — R399 Unspecified symptoms and signs involving the genitourinary system: Secondary | ICD-10-CM

## 2019-06-01 MED ORDER — AMOXICILLIN 500 MG PO CAPS
500 mg | ORAL_CAPSULE | Freq: Three times a day (TID) | ORAL | 0 refills | Status: AC
Start: 2019-06-01 — End: ?

## 2019-06-23 MED ORDER — IBUPROFEN 600 MG PO TABS
600 mg | ORAL_TABLET | Freq: Three times a day (TID) | ORAL | 0 refills | PRN
Start: 2019-06-23 — End: ?

## 2019-06-24 MED ORDER — IBUPROFEN 600 MG PO TABS
600 mg | ORAL_TABLET | Freq: Three times a day (TID) | ORAL | 0 refills | Status: AC | PRN
Start: 2019-06-24 — End: ?

## 2019-07-28 MED ORDER — IBUPROFEN 600 MG PO TABS
600 mg | ORAL_TABLET | Freq: Three times a day (TID) | ORAL | 0 refills | PRN
Start: 2019-07-28 — End: ?

## 2019-07-29 MED ORDER — IBUPROFEN 600 MG PO TABS
600 mg | ORAL_TABLET | Freq: Three times a day (TID) | ORAL | 0 refills | Status: AC | PRN
Start: 2019-07-29 — End: ?

## 2019-08-26 ENCOUNTER — Telehealth: Payer: BLUE CROSS/BLUE SHIELD | Attending: Rheumatology

## 2019-08-26 DIAGNOSIS — R5383 Other fatigue: Secondary | ICD-10-CM

## 2019-08-26 DIAGNOSIS — M3500 Sicca syndrome, unspecified: Secondary | ICD-10-CM
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
## 2019-08-31 ENCOUNTER — Ambulatory Visit: Payer: BLUE CROSS/BLUE SHIELD

## 2019-10-11 ENCOUNTER — Inpatient Hospital Stay

## 2019-11-14 ENCOUNTER — Telehealth: Payer: BLUE CROSS/BLUE SHIELD

## 2019-11-14 NOTE — Telephone Encounter
Call Back Request    MD:  Dr. Elmarie Shiley     Reason for call back: Pt having a really bad flare up and symptoms are bad as well. Thank you.     Any Symptoms:  [x]  Yes  []  No      ? If yes, what symptoms are you experiencing:     o Duration of symptoms (how long):    o Have you taken medication for symptoms (OTC or Rx):      Patient or caller has been notified of the 24-48 hour turnaround time.

## 2019-11-14 NOTE — Telephone Encounter
Please advise, see note below. Thank you

## 2019-11-21 NOTE — Telephone Encounter
Please call and schedule patient at earliest slot per Dr. Elmarie Shiley.

## 2019-11-21 NOTE — Telephone Encounter
Reply by: Lutricia Horsfall  Please add to next available

## 2019-12-29 ENCOUNTER — Ambulatory Visit: Payer: BLUE CROSS/BLUE SHIELD

## 2020-01-11 ENCOUNTER — Ambulatory Visit: Payer: PRIVATE HEALTH INSURANCE

## 2020-01-30 ENCOUNTER — Ambulatory Visit: Payer: PRIVATE HEALTH INSURANCE

## 2020-01-30 ENCOUNTER — Telehealth: Payer: PRIVATE HEALTH INSURANCE | Attending: Rheumatology

## 2020-01-30 DIAGNOSIS — M3500 Sicca syndrome, unspecified: Secondary | ICD-10-CM
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
## 2020-01-30 NOTE — Telephone Encounter
MD notified  

## 2020-01-31 ENCOUNTER — Ambulatory Visit: Payer: PRIVATE HEALTH INSURANCE

## 2020-02-08 ENCOUNTER — Ambulatory Visit: Payer: BLUE CROSS/BLUE SHIELD

## 2020-02-10 NOTE — Telephone Encounter
Dr. Elmarie Shiley - patient following up on message below.

## 2020-02-23 ENCOUNTER — Telehealth: Payer: PRIVATE HEALTH INSURANCE

## 2020-02-23 NOTE — Telephone Encounter
Called patient left message in regards to MD response. Patient needs to follow up with psychiatry.

## 2020-02-23 NOTE — Telephone Encounter
Call Back Request      Reason for call back: Pt requesting refill of Doxepin, as it was prescribed at sober living outpatient treatment. Pt did not have mg amount at time of request. Thank you.   [address], [place], [place] [address]      Any Symptoms:  []  Yes  [x]  No      ? If yes, what symptoms are you experiencing:    o Duration of symptoms (how long):    o Have you taken medication for symptoms (OTC or Rx):      Patient or caller has been notified of the 24-48 hour turnaround time.

## 2020-02-23 NOTE — Telephone Encounter
Would advise pt follow with psychiatry for it as they should also be aware she is going to be on that medication. I haven't seen her since 04/2019.    Reply by: Isaac Laud. Vrishank Moster

## 2020-02-27 ENCOUNTER — Ambulatory Visit: Payer: PRIVATE HEALTH INSURANCE

## 2020-02-27 DIAGNOSIS — Z01419 Encounter for gynecological examination (general) (routine) without abnormal findings: Secondary | ICD-10-CM

## 2020-02-27 DIAGNOSIS — Z113 Encounter for screening for infections with a predominantly sexual mode of transmission: Secondary | ICD-10-CM

## 2020-02-27 NOTE — Progress Notes
[facility]   OBSTETRICS, GYNECOLOGY & UROGYNECOLOGY     PATIENT: Vanessa Jensen  MRN: [id number]  DOB: Nov 28, 1985  DATE OF SERVICE: 02/27/2020  PCP: Linton Ham., MD     Chief Complaint:   Chief Complaint   Patient presents with   ??? wwv- std check       Subjective:  Vanessa Jensen is a 34 y.o. female G1P0010 here for STI screening, also due for annual well woman exam.    Pt moving to [facility] today to start inpatient treatment facility.   Has been working on mental health and sobriety, this year has been difficult with patient. But has been actively working on it.   Desires STI screening. Not currently sexually active but has had new partner since last check up in 2019.   Denies any vaginal discharge, odor, itching.   Declines contraception. Not currently on anything.   Does have hx of recurrent yeast infections, allergic to diflucan. Uses boric acid    Menses:   Patient's last menstrual period was 02/22/2020.  Age at first period (years):: 13  Regular periods: Start every how many days?: 28  Irregular periods: Start every how many days?:   How long do periods last (in days)::   Does bleeding occur between periods?: No  Does bleeding occur after intercourse?: No  Is pain associated with periods?: Yes    Sexually active: not currently  Hx of STI: denies  Hx of abnormal pap smears: see below. Desires repeat pap smear today  Last mammograms: n/a    Past Ob/Gyn Hx  OB History     Gravida   1    Para        Term        Preterm        AB   1    Living           SAB   1    TAB        Ectopic        Multiple        Live Births               Obstetric Comments   Hx of LEEP in 2006 and 2015. Last pap 03/2017.              See above for gyn history    Past medical/surgical/family history updated.    Past Medical Hx  Past Medical History:   Diagnosis Date   ??? Asthma    ??? Bipolar II disorder (HCC/RAF)    ??? Chronic fatigue, unspecified    ??? CIN III (cervical intraepithelial neoplasia III)     s/p LEEP 12/16   ??? Depression with anxiety     prior suicide attempt 2006 by substance overdose   ??? History of alcohol abuse     h/o alchohol withdrawls   ??? History of eating disorder    ??? History of pancreatitis 2001   ??? History of substance abuse (HCC/RAF)    ??? HPV in female 2005   ??? Insomnia    ??? Migraines    ??? PTSD (post-traumatic stress disorder)    ??? Sjogren's syndrome (HCC/RAF)    ??? Urticaria        Past Surgical Hx  Past Surgical History:   Procedure Laterality Date   ??? CERVICAL BIOPSY  W/ LOOP ELECTRODE EXCISION  2006, 07/19/2015   ??? DILATION AND CURETTAGE OF UTERUS  08/2013   ??? HX  COLPOSCOPY OF THE CERVIX  08/2014   ??? TOE SURGERY  08/2014    broken toe with torn ligaments in 08/2014 and dislocated ulna/wrist tear in 03/2015???    ??? WRIST SURGERY  05/2016    dislocated ulna, torn tendon       Past Family Hx  Family History   Problem Relation Age of Onset   ??? Crohn's disease Sister         half sister   ??? Arthritis Mother    ??? Bipolar disorder Mother         bipolar II   ??? Asthma Father    ??? Depression Father    ??? Hypertension Father    ??? Hyperlipidemia Father    ??? Stroke Father    ??? Substance Abuse Father    ??? Bipolar disorder Father         bipolar 1   ??? Alcohol abuse Maternal Grandmother    ??? Cancer Maternal Grandmother    ??? Alcohol abuse Maternal Grandfather    ??? Cancer Maternal Grandfather    ??? Alcohol abuse Paternal Grandfather    ??? Cancer Paternal Grandfather        Social Hx:  Social History     Socioeconomic History   ??? Marital status: Single     Spouse name: Not on file   ??? Number of children: Not on file   ??? Years of education: Not on file   ??? Highest education level: Not on file   Occupational History   ??? Occupation: student     Comment: integrative nutrition, holistic   Social Needs   ??? Financial resource strain: Not on file   ??? Food insecurity     Worry: Not on file     Inability: Not on file   ??? Transportation needs     Medical: Not on file     Non-medical: Not on file   Tobacco Use   ??? Smoking status: Current Every Day Smoker     Packs/day: 0.33     Years: 13.00     Pack years: 60.29     Start date: 02/04/2005   ??? Smokeless tobacco: Never Used   ??? Tobacco comment: 1/3 of a pack/day currently   Substance and Sexual Activity   ??? Alcohol use: Not Currently     Alcohol/week: 0.0 oz     Comment: a little bit of champagne during holidays since march 2019. In past was drinking 8-12 glasses of wine per week.    ??? Drug use: Yes     Types: Marijuana     Comment: marijuana nightly   ??? Sexual activity: Yes     Partners: Male     Birth control/protection: Condom     Comment: condoms consistently. single partner.    Lifestyle   ??? Physical activity     Days per week: Not on file     Minutes per session: Not on file   ??? Stress: Not on file   Relationships   ??? Social Wellsite geologist on phone: Not on file     Gets together: Not on file     Attends religious service: Not on file     Active member of club or organization: Not on file     Attends meetings of clubs or organizations: Not on file     Relationship status: Not on file   Other Topics Concern   ??? Not on file  Social History Narrative    Health coach       Objective:  Vitals:    02/27/20 0845   BP: 120/78   Pulse: 98   Weight: 127 lb 6.4 oz (57.8 kg)   Height: 5' 3'' (1.6 m)     Gen: No acute distress, well appearing  Breasts: symmetric bilaterally, no cervical or axillary LAD. No nipple discharge. No skin changes. Soft, no discrete masses palpated.   Abd: Soft nontender, nondistended  Pelvic:  URETHRA: normal appearing urethra with no masses, tenderness or lesions  CLITORIS: normal appearing clitoris with no hypertrophy or enlargement  BARTHOLIN'S GLANDS: normal appearing Bartholin's glands with no masses, tenderness or erythema  VULVA: normal appearing vulva with no masses, tenderness or lesions  VAGINA: normal appearing vagina, estrogenized, no masses or no lesions  CERVIX: Normal appearing cervix without discharge or lesions. Non-tender  UTERUS: uterus is normal size, shape, consistency and nontender  ADNEXA: No adnexal masses or tenderness to palpation  ANUS: no external hemorrhoids, fissures, or masses    Assessment and Plan:  1. Well woman exam with routine gynecological exam    2. Routine screening for STI (sexually transmitted infection)      1-  Pap /GC/CT collected per pt request  Counseling   - Discussed breast self awareness, clinical breast exams, and screening mammograms starting at 33 years old    - Pap screening recommendations (low risk pts) reviewed: Pap with HPV Q 3-5 yrs in women age 51-65, discontinue pap screening at age 63  - Calcium 1000-1300mg  /Vit D 600-800IU daily/weight bearing exercises recommended for bone health.  - Reviewed exercise and cardiovascular health recommendations  - Condoms/safe sex practices reviewed  - RTC for annual WWE, sooner PRN    2-  STI bloodwork collected      Orders Placed This Encounter   ??? RPR   ??? Pap smear 30+ yo (with HPV), [facility]   ??? HBS Antigen   ??? HCV Antibody Screen   ??? HIV-1/2 Ag/Ab 4th Generation with Reflex Confirmation       Electronically signed by:  Larey Seat, MD  [facility]

## 2020-02-27 NOTE — Addendum Note
Addended by: Theodoro Kos on: 02/27/2020 09:10 AM     Modules accepted: Orders

## 2020-02-28 LAB — HIV-1/2 Ag/Ab 4th Generation with Reflex Confirmation: HIV-1/2 AG/AB 4TH GENERATION WITH REFLEX CONFIRMATION: NONREACTIVE

## 2020-02-29 LAB — RPR: RPR: NONREACTIVE

## 2020-02-29 LAB — HPV DNA PCR: HPV TYPE 18: NEGATIVE

## 2020-02-29 LAB — HCV Ab Screen: HCV ANTIBODY SCREEN: NONREACTIVE

## 2020-02-29 LAB — HBs Ag: HEPATITIS B SURFACE ANTIGEN: NONREACTIVE

## 2020-03-01 LAB — Chlamydia trachomatis/Neisseria gonorrhoeae PCR

## 2020-03-11 LAB — Liquid-based pap smear

## 2020-03-13 ENCOUNTER — Ambulatory Visit: Payer: BLUE CROSS/BLUE SHIELD

## 2020-03-16 ENCOUNTER — Ambulatory Visit: Payer: PRIVATE HEALTH INSURANCE

## 2020-03-16 ENCOUNTER — Telehealth: Payer: BLUE CROSS/BLUE SHIELD

## 2020-03-16 DIAGNOSIS — S4381XS Sprain of other specified parts of right shoulder girdle, sequela: Secondary | ICD-10-CM

## 2020-03-16 DIAGNOSIS — M7918 Myalgia, other site: Secondary | ICD-10-CM

## 2020-03-16 DIAGNOSIS — M545 Low back pain: Secondary | ICD-10-CM

## 2020-03-16 DIAGNOSIS — M25519 Pain in unspecified shoulder: Secondary | ICD-10-CM

## 2020-03-16 DIAGNOSIS — M542 Cervicalgia: Secondary | ICD-10-CM

## 2020-03-16 DIAGNOSIS — G8929 Other chronic pain: Secondary | ICD-10-CM

## 2020-03-16 MED ORDER — BACLOFEN 10 MG PO TABS
10 mg | ORAL_TABLET | Freq: Every evening | ORAL | 0 refills | Status: AC
Start: 2020-03-16 — End: 2020-03-28

## 2020-03-16 NOTE — Telephone Encounter
Referred by:      MD Name:  Sheryle Hail      MD Specialty:  [facility]       Phone:        Diagnosis (must be confirmed by an MD, not per patient):         Medical records related to this diagnosis sent to office (Yes/No):     Date of Scheduled Appt (At Least 2 Weeks Out): 03/27/20    Notified patient they may be phoned to be rescheduled/cancelled after review of medical records.

## 2020-03-16 NOTE — Patient Instructions
F/u pain management  Heat to affected areas  Yoga   Baclofen as directed
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
## 2020-03-23 ENCOUNTER — Non-Acute Institutional Stay: Payer: BLUE CROSS/BLUE SHIELD

## 2020-03-27 ENCOUNTER — Non-Acute Institutional Stay: Payer: BLUE CROSS/BLUE SHIELD

## 2020-03-27 DIAGNOSIS — M47816 Spondylosis without myelopathy or radiculopathy, lumbar region: Secondary | ICD-10-CM

## 2020-03-27 DIAGNOSIS — M7918 Myalgia, other site: Secondary | ICD-10-CM

## 2020-03-27 DIAGNOSIS — M47812 Spondylosis without myelopathy or radiculopathy, cervical region: Secondary | ICD-10-CM

## 2020-03-27 MED ORDER — BACLOFEN 5 MG PO TABS
5 mg | ORAL_TABLET | Freq: Two times a day (BID) | ORAL | 0 refills | Status: AC | PRN
Start: 2020-03-27 — End: ?

## 2020-03-27 NOTE — Progress Notes
Vanessa Jensen  Department of Anesthesiology & Pain Medicine        Consultation Note:  Date of Service: 03/27/2020  Primary Care Physician: Linton Ham., MD    Referring Physician: No ref. provider found    Chief Complaint:   Back Pain and Wrist Pain      Interim History:  The patient is a very pleasant 34 y.o. female with a past medical history significant for Bipolar depression, anxiety, alcohol use disorder, opioid use disorder (per chart review), bulimia, anorexia, Sjogren's syndrome, chronic neck and low back pain who presents for further evaluation and possible management of neck and low back pain. The patient reports she has had chronic low back pain for many years which began after a gymnastic injury. Today her pain is located in his Left low back over her PSIS/L5 facet joint. She states over the past few weeks she has had radiation of her pain into her left buttock, posterior thigh, calf into bottom of left foot. She also complains of pain in her neck into her trapezius muscles, right periscapular area. She also has chronic wrist and ankle pain. The pain is not associated with numbness, tingling, or weakness. The patient has had pain refractory to NSAIDs, APAP, topicals, medrol dose pack, gabapentin, muscle relaxants, chiropractor and physical therapy. She was referred to our clinic for possible interventional treatment options.             LOCATION OF PAIN:   -Left low back into buttock, posterior thigh, calf into bottom of left foot  -Midline neck into trapezius muscles   QUALITY: throbbing, aching   VAS AVG 7  VAS AT WORST 10  VAS AT BEST 5        DURATION: 20 years      NUMBNESS/TINGLING: Denies  WEAKNESS: Denies  WORSENING FACTORS:  Sitting, lumbar flexion, lateral position/twisting, stress   ALLEVIATING FACTORS:  Lying supine with legs elevated, standing with good posture   URINARY INCONTINENCE: No  FECAL INCONTINENCE: No  SADDLE ANESTHESIA : No  SLEEP HYGIENE:  +difficulty initiating/maintaining sleep    Current treatment:   Baclofen 10 mg at bedtime  Gabapentin 300 mg BID  Clonazepam   Quetiapine   Buspirone   Ibuprofen     Chiropractor: twice weekly , adjustments  Acupuncture: effective at E/W, since changed not as effective     Prior Treatment:                     Tried  Efficacy    NSAIDS Y Ibuprofen - effective   Tylenol Y    Opioids N    Gabapentin Y Effective   Lyrica N    TCA/SSRI/SNRI N    Muscle Relaxants Y Baclofen, flexeril - unsure if effective   Other: N    Topicals Y Tiger balm    CBD/Medrol Dose Pack Y Medrol: effective   CSI/Epidural Steroids Y -Facet intra-articular injections - no benefit  -CESI   Nerve Block/RFA N    Chiropractor  Acupuncture N Chiropractor: effective, mild  Acupuncture: e/w medicine, cupping/TPI - was effective   Physical Therapy Y -Multiple Sessions - land therapy.  -last in Jan 2020, 6 sessions          CURES Report:  Last reviewed on 03/27/2020 and was appropriate.      PAST MEDICAL HISTORY:  Past Medical History:   Diagnosis Date   ??? Asthma    ??? Bipolar II disorder (  HCC/RAF)    ??? Chronic fatigue, unspecified    ??? CIN III (cervical intraepithelial neoplasia III)     s/p LEEP 12/16   ??? Depression with anxiety     prior suicide attempt 2006 by substance overdose   ??? History of alcohol abuse     h/o alchohol withdrawls   ??? History of eating disorder    ??? History of pancreatitis 2001   ??? History of substance abuse (HCC/RAF)    ??? HPV in female 2005   ??? Insomnia    ??? Migraines    ??? PTSD (post-traumatic stress disorder)    ??? Sjogren's syndrome (HCC/RAF)    ??? Urticaria        PAST SURGICAL HISTORY:   Past Surgical History:   Procedure Laterality Date   ??? CERVICAL BIOPSY  W/ LOOP ELECTRODE EXCISION  2006, 07/19/2015   ??? DILATION AND CURETTAGE OF UTERUS  08/2013   ??? HX COLPOSCOPY OF THE CERVIX  08/2014   ??? TOE SURGERY  08/2014    broken toe with torn ligaments in 08/2014 and dislocated ulna/wrist tear in 03/2015???    ??? WRIST SURGERY 05/2016    dislocated ulna, torn tendon       CURRENT MEDICATIONS:  Current Outpatient Medications   Medication Sig   ??? ALBUTEROL 90 mcg/act inhaler INHALE 2 PUFFS BY MOUTH EVERY 6 HOURS AS NEEDED   ??? albuterol 90 mcg/act inhaler inhale 2 puffs 5-30 minutes prior to exercise.   ??? busPIRone 15 mg tablet Take 1 tablet (15 mg total) by mouth two (2) times daily.   ??? cetirizine 5 mg tablet Take 1 tablet (5 mg total) by mouth daily.   ??? clonazePAM 1 mg tablet Take 1 mg by mouth two (2) times daily.   ??? fluticasone-salmeterol (WIXELA INHUB) 500-50 mcg/dose diskus Inhale 1 puff two (2) times daily.   ??? gabapentin 300 mg capsule Take 2 capsules (600 mg total) by mouth three (3) times daily.   ??? hydrOXYzine 50 mg capsule    ??? ibuprofen 600 mg tablet Take 1 tablet (600 mg total) by mouth every eight (8) hours as needed (pain) . If you are needing to use this frequently/regularly for pain or headache, please have visit with healthcare provider to discuss further management options.Marland Kitchen   ??? ibuprofen 800 mg tablet    ??? Misc Natural Products (DANDELION ROOT PO) Take by mouth.   ??? multivitamin tablet Take by mouth daily.   ??? pantoprazole 40 mg DR tablet TAKE 1 TABLET BY MOUTH EVERY DAY.   ??? QUEtiapine 100 mg tablet Take 1 tablet (100 mg total) by mouth at bedtime.   ??? QUEtiapine 50 mg tablet    ??? UNABLE TO FIND Citrigen .   ??? VRAYLAR 1.5 MG capsule    ??? baclofen 10 mg tablet Take 1 tablet (10 mg total) by mouth at bedtime. (Patient not taking: Reported on 03/27/2020.)   ??? Licorice, Glycyrrhiza glabra, (LICORICE PO) Take by mouth.     No current facility-administered medications for this visit.          Physical Examination:   Vitals:   Vitals:    03/27/20 1122   BP: 97/63   Pulse: 81   Resp: 17   Temp: 36.8 ???C (98.2 ???F)   TempSrc: Forehead   SpO2: 99%   Weight: 127 lb (57.6 kg)   Height: 5' 3'' (1.6 m)        General: AAOx3, NAD and appropriately groomed  Body habitus: normal   Affect/Mood: appropriate  Pulm:  Breathing non-labored Heart:  Regular rate     Gait: Gait is anantalgic.     -The patient is able to ambulate without assistance   SPINE:  Palpation at the midline cervical - sacral level reveals no tenderness    Range of Motion:   Cervical Presence of Pain Lumbar Presence of Pain   Flexion Full Y Full Y   Extension Limited, mild Y Limited, mild Y   Lat Rotation Limited, mild Y          Upper Extremity Motor:  C5-T1 Deltoid Bicep Tricep WE WF HG   RUE 5 5 5 5 5 5    LUE 5 5 5 5 5 5        Lower Extremity Motor:  L2-S1 Hip Flex KE KF AT EHL  PF   RLE 5 5 5 5 5 5    LLE 5 5 5 5 5 5        Sensation (light touch):   Right Upper Extremity: normal   Left Upper Extremity: normal   Right Lower Extremity: normal   Left Lower Extremity: normal    Provocative Tests Result   Spurling's Neg B   Cervical Facet Loading Pos B   Lumbar Facet Loading Pos B   SLR supine Neg B   Patrick/FABER Pos L   Scour's Pos L   SIJ tenderness with palpation Pos L   Trochanteric bursa tenderness Neg B        TPs:   + palpable cervical paraspinals TP      Reflexes:  DTR R L DTR R L DTR R L   Triceps 2 2 Patellar 2 2 Babinski NEG NEG   Biceps 2 2 Achilles 2 2 Clonus NEG NEG         Hoffman's NEG NEG       Skin:  No scars in examined area. No rashes/suspicious skin lesions.  Extremities No edema present                  Imaging Studies:     2019 Lumbar MRI:     For the purposes of counting, note that the S1 vertebral body is reticular in confirmation, and there is a well-formed S1-S2 intervertebral disc. Mild disc degeneration is seen at L5-S1. The L1 vertebra demonstrates a lumbar-type right-sided transverse   process; however, there appears to be a small articulating left-sided transverse process.  ???  Distal spinal cord, conus medullaris, and cauda equina are unremarkable.    ???  T12-L1: No stenosis  L1-2: No stenosis  L2-3: No stenosis  L3-4: No stenosis  L4-5: Trace bulging. No stenosis.  L5-S1: Mild bulging small central protrusion and moderate to severe left as well as mild right facet hypertrophy with mild left foraminal narrowing.   ???  No epidural collection or mass is seen in the spinal canal. Mild edema is seen at the severely hypertrophied left L5-S1 facet joint.  ???  ???  IMPRESSION:  ???  See above regarding counting methodology. Mild disc degeneration is seen at L5-S1 with disc bulging and small superimposed central protrusion.   Severe left L5-S1 facet hypertrophy results in minimal left foraminal narrowing. Trace edematous changes are noted associated with the facet joint which may correspond to symptoms of pain.  No high-grade foraminal or canal stenosis at any level.    2019 Cervical MRI:        Mild C5-6 disc degeneration is present.  Alignment, vertebral body heights, and marrow signal are within normal limits. The spinal cord has normal signal, caliber and contour.  ???  C2-3: No stenosis  C3-4: No stenosis  C4-5: No stenosis  C5-6: Disc/osteophyte complex and right more than left uncovertebral spurring with minimal right foraminal narrowing.  C6-7: No stenosis  C7-T1: No stenosis  ???  No epidural collection or mass is seen in the spinal canal. Paraspinal soft tissues are without significant abnormality.  ???  ???  IMPRESSION:  ???  Mild C5-6 disc degeneration with right parietal uncovertebral spurring resulting in minimal right foraminal narrowing. No significant foraminal or canal stenosis is seen at any level.        Assessment:    1. Myofascial Pain  2. Severe L L5/S1 facet arthropathy  3. Lumbar spondylosis w/L5-S1 DDD  4. SI joint dysfunction  5. Cervical Spondylosis w/C5-6 DDD    Impression:    34 y.o. female with a past medical history significant for Bipolar depression, anxiety, alcohol use disorder, opioid use disorder (per chart review), bulimia, anorexia, Sjogren's syndrome, chronic neck and low back pain who presents for further evaluation and possible management of neck and low back pain.  We discussed treatment options and I recommended the patient restart physical therapy. We discussed risks/benefits of a trigger point injection procedure including but not limited to infection, bleeding, treatment failure, worsening pain, & COVID related risks, and the patient agreed to proceed.  We will also plan for a L4 & L5 medial nerve blocks under IV sedation for her severe facet arthropathy.                         Treatment Recommendations:   1) Medical Modalities:  -Continue current medication regimen  -baclofen 5 mg BID refilled  2) Interventional Modalities:  -TPI today  -Left L4, L5 medial nerve blocks for L5-S1 facet arthropathy w/IV sedation  -Consider SI joint injection in the future  3) Behavioral Medicine Modalities: None  4) Imaging: Consider C/L MRI if no improvement  5) Other Modalities: Referral for PT    A total over 60 minutes of time was spent on this encounter including reviewing prior records, imaging and laboratory results, and explanation of diagnosis, prognosis, risks and benefits of treatments, EHR documentation, importance of instruction and compliance, education, counseling and coordination of all future care and a detailed question and answer session.     Lonzo Candy, MD  Assistant Clinical Professor  Interventional Spine & Pain Medicine   Department of Anesthesiology  [facility] [place], [place]

## 2020-03-28 MED ADMIN — BUPIVACAINE HCL (PF) 0.25 % IJ SOLN: 10 mL | INTRAMUSCULAR | @ 18:00:00 | Stop: 2020-03-27

## 2020-04-02 ENCOUNTER — Non-Acute Institutional Stay: Payer: BLUE CROSS/BLUE SHIELD

## 2020-04-09 ENCOUNTER — Ambulatory Visit: Payer: PRIVATE HEALTH INSURANCE

## 2020-04-24 ENCOUNTER — Ambulatory Visit: Payer: PRIVATE HEALTH INSURANCE

## 2020-04-25 MED ORDER — ALBUTEROL SULFATE HFA 108 (90 BASE) MCG/ACT IN AERS
1 refills | Status: AC
Start: 2020-04-25 — End: ?

## 2020-04-26 MED ORDER — ALBUTEROL SULFATE HFA 108 (90 BASE) MCG/ACT IN AERS
1 refills | 25.00000 days
Start: 2020-04-26 — End: ?

## 2020-04-27 MED ORDER — ALBUTEROL SULFATE HFA 108 (90 BASE) MCG/ACT IN AERS
1 refills
Start: 2020-04-27 — End: ?

## 2020-05-02 MED ORDER — BACLOFEN 5 MG PO TABS
5 mg | ORAL_TABLET | Freq: Two times a day (BID) | ORAL | 0 refills | Status: AC | PRN
Start: 2020-05-02 — End: ?

## 2020-05-07 ENCOUNTER — Telehealth: Payer: BLUE CROSS/BLUE SHIELD

## 2020-05-07 NOTE — Telephone Encounter
Called pt and left a message to call back regarding scheduling procedure ordered by Dr. Harrie Foreman.     [employer]

## 2020-05-07 NOTE — Telephone Encounter
Called pt and left a message to call back and schedule procedure, we received authorization from insurance.     [employer]

## 2020-05-07 NOTE — Telephone Encounter
Please advise 

## 2020-05-07 NOTE — Telephone Encounter
Message to Practice/Provider      Message: Pt states she received letter from [facility] indicating that nerve block procedure was denied. Pt states she does not want to schedule procedure yet and is asking office to appeal.       Return call is not being requested by the patient or caller.    Patient or caller has been notified of the 24-48 hour processing turnaround time if applicable.

## 2020-05-08 MED ORDER — CETIRIZINE HCL 5 MG PO TABS
5 mg | ORAL_TABLET | Freq: Every day | ORAL | 3 refills
Start: 2020-05-08 — End: ?

## 2020-05-09 MED ORDER — CETIRIZINE HCL 5 MG PO TABS
5 mg | ORAL_TABLET | Freq: Every day | ORAL | 0 refills | Status: AC
Start: 2020-05-09 — End: ?

## 2020-05-15 NOTE — Telephone Encounter
Called pt, no answer unable to leave message, VM is not set up.    [facility] if pt calls back please transfer call    [facility]   [phone]

## 2020-05-15 NOTE — Telephone Encounter
Reply by: Lonzo Candy    Would recommend scheduling after her surgery.    TY

## 2020-05-15 NOTE — Telephone Encounter
Hi Dr. Harrie Foreman, pt wants to schedule her procedure on 10/11 but she's having another major surgery on Tuesday 10/12 on her wrist, she wants to know if she will be ok to proceed with the her surgery or schedule your procedure on 10/18. Please advise Thank you

## 2020-05-16 NOTE — Telephone Encounter
Called pt, unable to leave a message VM is full.    [facility] if pt calls back please transfer to [facility]

## 2020-05-25 NOTE — Telephone Encounter
Spoke with pt, prefers appointment for 10/18, will contact next week to schedule procedure.

## 2020-05-28 NOTE — Telephone Encounter
Spoke with pt, scheduled procedure for 10/18

## 2020-06-06 ENCOUNTER — Telehealth: Payer: PRIVATE HEALTH INSURANCE

## 2020-06-06 DIAGNOSIS — M47816 Spondylosis without myelopathy or radiculopathy, lumbar region: Secondary | ICD-10-CM

## 2020-06-06 NOTE — Progress Notes
Patient Consent to Telehealth   The patient agreed to participate in the video visit prior to joining the visit.      [facility]  Department of Anesthesiology & Pain Medicine        Date of Service: 06/06/2020  Primary Care Physician: Linton Ham., MD      Chief Complaint:   Back Pain ([place])    History of Present Illness:   34 y.o. female with a past medical history significant for Bipolar depression, anxiety, alcohol use disorder, opioid use disorder (per chart review), bulimia, anorexia, Sjogren's syndrome, chronic neck and low back pain who presents for further evaluation and possible management of neck and low back pain. The patient reports she has had chronic low back pain for many years which began after a gymnastic injury. Today her pain is located in his Left low back over her PSIS/L5 facet joint into her buttock. She also complains of pain in her neck into her trapezius muscles, right periscapular area. She also has chronic wrist and ankle pain. The pain is not associated with numbness, tingling, or weakness. The patient has had pain refractory to NSAIDs, APAP, topicals, medrol dose pack, gabapentin, muscle relaxants, chiropractor and physical therapy. She was referred to our clinic for possible interventional treatment options.     Interval History:   34 y.o.  female with a PMHX significant for Bipolar depression, anxiety, alcohol use disorder, opioid use disorder (per chart review), bulimia, anorexia, Sjogren's syndrome, chronic neck and low back pain presenting for continued management of low back pain.   Today the patient reports she had 80-90% improvement with her set of medial nerve branch blocks. She had increased ROM with relatively minimal pain. She would like to schedule her next set of nerve blocks. The patient denies any new loss of balance, loss of bowel or bladder control. No fevers, night sweats, chills, rashes or sores. No other major changes to their health history.     Current treatment:   Baclofen 10 mg at bedtime  Gabapentin 300 mg BID  Clonazepam   Quetiapine   Buspirone   Ibuprofen   ???  Chiropractor: twice weekly , adjustments  Acupuncture: effective at E/W, since changed not as effective     Injection History:  06/04/20: L3, L4, L5 medial nerve branch blocks - 80% improvement   ???  Prior Treatment:                                                                                                                                                      ???         Tried                Efficacy    NSAIDS Y Ibuprofen - effective   Tylenol Y ???  Opioids N ???   Gabapentin Y Effective   Lyrica N ???   TCA/SSRI/SNRI N ???   Muscle Relaxants Y Baclofen, flexeril - unsure if effective   Other: N ???   Topicals Y Tiger balm    CBD/Medrol Dose Pack Y Medrol: effective   CSI/Epidural Steroids Y -Facet intra-articular injections - no benefit  -CESI   Nerve Block/RFA N ???   Chiropractor  Acupuncture N Chiropractor: effective, mild  Acupuncture: e/w medicine, cupping/TPI - was effective   Physical Therapy Y -Multiple Sessions - land therapy.  -last in Jan 2020, 6 sessions   ???   ???        MEDICATIONS:    Current Outpatient Medications   Medication Sig   ??? albuterol 90 mcg/act inhaler INHALE 2 PUFFS BY MOUTH EVERY 6 HOURS AS NEEDED.   ??? baclofen 5 mg tablet Take 1 tablet (5 mg total) by mouth two (2) times daily as needed.   ??? ibuprofen 600 mg tablet Take 1 tablet (600 mg total) by mouth every eight (8) hours as needed (pain) . If you are needing to use this frequently/regularly for pain or headache, please have visit with healthcare provider to discuss further management options.Marland Kitchen   ??? albuterol 90 mcg/act inhaler inhale 2 puffs 5-30 minutes prior to exercise.   ??? clonazePAM 0.5 mg tablet    ??? clonazePAM 1 mg tablet Take 1 mg by mouth two (2) times daily.   ??? ibuprofen 800 mg tablet    ??? Licorice, Glycyrrhiza glabra, (LICORICE PO) Take by mouth.   ??? Misc Natural Products (DANDELION ROOT PO) Take by mouth.   ??? multivitamin tablet Take by mouth daily.   ??? QUEtiapine 50 mg tablet    ??? UNABLE TO FIND Citrigen .   ??? VRAYLAR 1.5 MG capsule      No current facility-administered medications for this visit.             Physical Examination:   *part of exam* referred from prior visit    Alert & oriented to person, place, time, event Yes    MOOD/AFFECT: Normal mood and affect        Affect/Mood: appropriate  Pulm:  Breathing non-labored  Heart:  Regular rate   ???  Gait: Gait is anantalgic.     -The patient is able to ambulate without assistance   SPINE:  Palpation at the midline cervical - sacral level reveals no tenderness  ???  Range of Motion:  ??? Cervical Presence of Pain Lumbar Presence of Pain   Flexion Full Y Full Y   Extension Limited, mild Y Limited, mild Y   Lat Rotation Limited, mild Y   ???   ???  ???  Upper Extremity Motor:  C5-T1 Deltoid Bicep Tricep WE WF HG   RUE 5 5 5 5 5 5    LUE 5 5 5 5 5 5    ???  ???  Lower Extremity Motor:  L2-S1 Hip Flex KE KF AT EHL  PF   RLE 5 5 5 5 5 5    LLE 5 5 5 5 5 5    ???  ???  Sensation (light touch):                 Right Upper Extremity: normal                 Left Upper Extremity: normal  Right Lower Extremity: normal                 Left Lower Extremity: normal  ???  Provocative Tests Result   Spurling's Neg B   Cervical Facet Loading Pos B   Lumbar Facet Loading Pos B   SLR supine Neg B   Patrick/FABER Pos L   Scour's Pos L   SIJ tenderness with palpation Pos L   Trochanteric bursa tenderness Neg B                    ???  TPs:   + palpable cervical paraspinals TP  ???  ???  Reflexes:  DTR R L   Babinski NEG NEG   Clonus NEG NEG   Hoffman's NEG NEG   ???  ???  Skin:  No scars in examined area. No rashes/suspicious skin lesions.  Extremities No edema present      Imaging Studies:     2019 Lumbar MRI:   ???  For the purposes of counting, note that the S1 vertebral body is reticular in confirmation, and there is a well-formed S1-S2 intervertebral disc. Mild disc degeneration is seen at L5-S1. The L1 vertebra demonstrates a lumbar-type right-sided transverse   process; however, there appears to be a small articulating left-sided transverse process.  ???  Distal spinal cord, conus medullaris, and cauda equina are unremarkable. ???  ???  T12-L1: No stenosis  L1-2: No stenosis  L2-3: No stenosis  L3-4: No stenosis  L4-5: Trace bulging. No stenosis.  L5-S1: Mild bulging small central protrusion and moderate to severe left as well as mild right facet hypertrophy with mild left foraminal narrowing.   ???  No epidural collection or mass is seen in the spinal canal. Mild edema is seen at the severely hypertrophied left L5-S1 facet joint.  ???  ???  IMPRESSION:  ???  See above regarding counting methodology. Mild disc degeneration is seen at L5-S1 with disc bulging and small superimposed central protrusion.   Severe left L5-S1 facet hypertrophy results in minimal left foraminal narrowing. Trace edematous changes are noted associated with the facet joint which may correspond to symptoms of pain.  No high-grade foraminal or canal stenosis at any level.  ???  2019 Cervical MRI:                                                                Mild C5-6 disc degeneration is present. Alignment, vertebral body heights, and marrow signal are within normal limits. The spinal cord has normal signal, caliber and contour.  ???  C2-3: No stenosis  C3-4: No stenosis  C4-5: No stenosis  C5-6: Disc/osteophyte complex and right more than left uncovertebral spurring with minimal right foraminal narrowing.  C6-7: No stenosis  C7-T1: No stenosis  ???  No epidural collection or mass is seen in the spinal canal. Paraspinal soft tissues are without significant abnormality.  ???  ???  IMPRESSION:  ???  Mild C5-6 disc degeneration with right parietal uncovertebral spurring resulting in minimal right foraminal narrowing. No significant foraminal or canal stenosis is seen at any level.                                            ???  Assessment:    1. Severe L L5/S1 facet arthropathy  2. Myofascial Pain  3. Lumbar spondylosis w/L5-S1 DDD  4. SI joint dysfunction  5. Cervical Spondylosis w/C5-6 DDD    Impression:    34 y.o.  female with a PMHX significant for Bipolar depression, anxiety, alcohol use disorder, opioid use disorder (per chart review), bulimia, anorexia, Sjogren's syndrome, chronic neck and low back pain presenting for continued management of low back pain.  Their history and physical is consistent with a possible diagnosis of lumbar facet mediated pain. We discussed treatment options and I recommended the patient a repeat trial of lumbar medial nerve branch blocks. We discussed risks/benefits of the procedure including but not limited to infection, bleeding, treatment failure, worsening pain, & COVID related risks, and the patient agreed to proceed.               Treatment Recommendations:   1) Medical Modalities:  -Continue current medication regimen  2) Interventional Modalities:   -Repeat L L3-5 medial nerve branch blocks under IV sedation  3) Behavioral Medicine Modalities: None  4) Imaging: None  5) Other Modalities: Continue PT/HEP    Lonzo Candy, MD  Assistant Clinical Professor  Interventional Spine & Pain Medicine   Department of Anesthesiology  [facility] [place], [place]

## 2020-08-20 ENCOUNTER — Ambulatory Visit: Payer: BLUE CROSS/BLUE SHIELD

## 2020-08-21 MED ORDER — BACLOFEN 5 MG PO TABS
5 mg | ORAL_TABLET | Freq: Two times a day (BID) | ORAL | 0 refills | Status: AC | PRN
Start: 2020-08-21 — End: ?

## 2020-12-14 ENCOUNTER — Ambulatory Visit: Payer: BLUE CROSS/BLUE SHIELD

## 2020-12-14 MED ORDER — BACLOFEN 5 MG PO TABS
5 mg | ORAL_TABLET | Freq: Two times a day (BID) | ORAL | 0 refills | Status: AC | PRN
Start: 2020-12-14 — End: ?

## 2020-12-21 ENCOUNTER — Telehealth: Payer: BLUE CROSS/BLUE SHIELD

## 2020-12-21 NOTE — Telephone Encounter
Spoke with pt and informed her to come in to do self swab, she wants to check for BV/Yeast, GC/Chlamydi. Annual scheduled for 08/01 at 2:45 with RL.

## 2020-12-21 NOTE — Telephone Encounter
Appointment Accommodation Request      Appointment Type: gyn    Reason for sooner request: t requesting accomodation as she is aving vaginal symptoms itching and burning    Date/Time Requested (If any): any    Last seen by MD: 02/27/20    Any Symptoms:  []  Yes  [x]  No       If yes, what symptoms are you experiencing:   o Duration of symptoms (how long):     Patient or caller was offered an appointment but declined.    Patient or caller was advised to seek emergency services if conditions are urgent or emergent.    Patient has been notified of the 24-48 hour turnaround time.

## 2021-02-20 ENCOUNTER — Ambulatory Visit: Payer: PRIVATE HEALTH INSURANCE

## 2021-02-21 MED ORDER — IBUPROFEN 800 MG PO TABS
800 mg | ORAL_TABLET | Freq: Three times a day (TID) | ORAL | 0 refills | Status: AC | PRN
Start: 2021-02-21 — End: ?

## 2021-02-21 MED ORDER — BACLOFEN 5 MG PO TABS
5 mg | ORAL_TABLET | Freq: Two times a day (BID) | ORAL | 0 refills | Status: AC | PRN
Start: 2021-02-21 — End: ?

## 2021-03-14 ENCOUNTER — Telehealth: Payer: PRIVATE HEALTH INSURANCE

## 2021-03-14 DIAGNOSIS — J4520 Mild intermittent asthma, uncomplicated: Secondary | ICD-10-CM

## 2021-03-14 MED ORDER — ALBUTEROL SULFATE HFA 108 (90 BASE) MCG/ACT IN AERS
0 refills | Status: AC
Start: 2021-03-14 — End: ?

## 2021-03-14 NOTE — Progress Notes
Telemedicine Note     PATIENT:  Vanessa Jensen  MRN:  [id number]  DOB:  November 17, 1985  DATE OF SERVICE:  03/14/2021    PRIMARY CARE PROVIDER: Linton Ham., MD    No chief complaint on file.        Subjective:     Vanessa Jensen is a a 35 y.o. female     Patient Consent to Telehealth Questionnaire   [facility] TELEHEALTH PRECHECKIN QUESTIONS 03/14/2021   By clicking ''I Agree'', I consent to the below:  I Agree     - I agree  to be treated via a video visit and acknowledge that I may be liable for any relevant copays or coinsurance depending on my insurance plan.  - I understand that this video visit is offered for my convenience and I am able to cancel and reschedule for an in-person appointment if I desire.  - I also acknowledge that sensitive medical information may be discussed during this video visit appointment and that it is my responsibility to locate myself in a location that ensures privacy to my own level of comfort.  - I also acknowledge that I should not be participating in a video visit in a way that could cause danger to myself or to those around me (such as driving or walking).  If my provider is concerned about my safety, I understand that they have the right to terminate the visit.     Prefers telemedicine visit vs in-person visit for follow-up visit.  Consent obtained.    P/w  refill of Albuterol     Asthma   Uses Albuterol once a day   Benefits and tolerates it     Doing well off steroid inhalers     Past Medical History:   Diagnosis Date   ? Asthma    ? Bipolar II disorder (HCC/RAF)    ? Chronic fatigue, unspecified    ? CIN III (cervical intraepithelial neoplasia III)     s/p LEEP 12/16   ? Depression with anxiety     prior suicide attempt 2006 by substance overdose   ? History of alcohol abuse     h/o alchohol withdrawls   ? History of eating disorder    ? History of pancreatitis 2001   ? History of substance abuse (HCC/RAF)    ? HPV in female 2005   ? Insomnia    ? Migraines    ? PTSD (post-traumatic stress disorder)    ? Sjogren's syndrome (HCC/RAF)    ? Urticaria        Past Surgical History:   Procedure Laterality Date   ? CERVICAL BIOPSY  W/ LOOP ELECTRODE EXCISION  2006, 07/19/2015   ? DILATION AND CURETTAGE OF UTERUS  08/2013   ? HX COLPOSCOPY OF THE CERVIX  08/2014   ? TOE SURGERY  08/2014    broken toe with torn ligaments in 08/2014 and dislocated ulna/wrist tear in 03/2015?    ? WRIST SURGERY  05/2016    dislocated ulna, torn tendon       Allergies   Allergen Reactions   ? Clindamycin    ? Nitrofurantoin    ? Tea      Green tea   ? Fluconazole Hives   ? Metronidazole Hives   ? Sulfa Antibiotics Hives       Current Outpatient Medications   Medication Sig   ? albuterol 90 mcg/act inhaler inhale 2 puffs 5-30 minutes prior to exercise.   ? albuterol  90 mcg/act inhaler INHALE 2 PUFFS BY MOUTH EVERY 6 HOURS AS NEEDED.   ? baclofen 5 mg tablet Take 1 tablet (5 mg total) by mouth two (2) times daily as needed.   ? clonazePAM 0.5 mg tablet    ? clonazePAM 1 mg tablet Take 1 mg by mouth two (2) times daily.   ? ibuprofen 600 mg tablet Take 1 tablet (600 mg total) by mouth every eight (8) hours as needed (pain) . If you are needing to use this frequently/regularly for pain or headache, please have visit with healthcare provider to discuss further management options..   ? ibuprofen 800 mg tablet Take 1 tablet (800 mg total) by mouth three (3) times daily as needed (pain).   ? Licorice, Glycyrrhiza glabra, (LICORICE PO) Take by mouth.   ? Misc Natural Products (DANDELION ROOT PO) Take by mouth.   ? multivitamin tablet Take by mouth daily.   ? QUEtiapine 50 mg tablet    ? UNABLE TO FIND Citrigen .   ? VRAYLAR 1.5 MG capsule    ? [DISCONTINUED] albuterol 90 mcg/act inhaler INHALE 2 PUFFS BY MOUTH EVERY 6 HOURS AS NEEDED.     No current facility-administered medications for this visit.         Review of Systems:  Pertinent items are noted in HPI.    Objective:                  Gen -  NAD              HEENT - general inspection wnl              Neck - symmetric               Lungs - normal respiratory effort              Psych / Neuro - A&O x 4, normal affect / insight / judgement    LABS:  Lab Results   Component Value Date    WBC 6.66 04/26/2019    HGB 12.5 04/26/2019    HCT 37.3 04/26/2019    MCV 89.9 04/26/2019    PLT 246 04/26/2019     Lab Results   Component Value Date    CREAT 0.69 04/26/2019    BUN 10 04/26/2019    NA 138 04/26/2019    K 4.4 04/26/2019    CL 103 04/26/2019    CO2 23 04/26/2019     Lab Results   Component Value Date    ALT 13 04/26/2019    AST 14 04/26/2019    ALKPHOS 40 04/26/2019    BILITOT 0.4 04/26/2019     Lab Results   Component Value Date    TSH 0.74 02/03/2020     No results found for: CHOL, CHOLDLCAL, TRIGLY  Lab Results   Component Value Date    HGBA1C 5.3 10/11/2018         Assessment & Plan:     Diagnoses and all orders for this visit:    Mild intermittent asthma without complication  stable  -     albuterol 90 mcg/act inhaler; INHALE 2 PUFFS BY MOUTH EVERY 6 HOURS AS NEEDED.           Follow-up:   No future appointments.      Per H.R. [id number], Division B, Section 101 and 102, the Coronavirus Preparedness and Response Supplemental Appropriations Act of 2020, this visit was conducted via video and audio telecommunication  with the patient?s verbal consent after verifying their name and date of birth. Vitals were not taken during this telemedicine appointment.   26 minutes were spent personally by me today on this encounter which may include today?s pre-visit review of the chart, time spent during the visit, and  today?s time spent  after the visit  documenting and coordinating care.     The above plan of care, diagnosis, order, and follow-up were discussed with the patient. Questions related to this recommended plan of care were answered.

## 2021-03-18 ENCOUNTER — Non-Acute Institutional Stay: Payer: PRIVATE HEALTH INSURANCE

## 2021-03-23 MED ORDER — IBUPROFEN 800 MG PO TABS
800 mg | ORAL_TABLET | Freq: Three times a day (TID) | ORAL | 0 refills | PRN
Start: 2021-03-23 — End: ?

## 2021-03-25 MED ORDER — IBUPROFEN 800 MG PO TABS
800 mg | ORAL_TABLET | Freq: Three times a day (TID) | ORAL | 0 refills | Status: AC | PRN
Start: 2021-03-25 — End: ?

## 2021-04-05 MED ORDER — ALBUTEROL SULFATE HFA 108 (90 BASE) MCG/ACT IN AERS
2 refills | Status: AC
Start: 2021-04-05 — End: ?

## 2021-04-28 MED ORDER — IBUPROFEN 800 MG PO TABS
800 mg | ORAL_TABLET | Freq: Three times a day (TID) | ORAL | 0 refills | PRN
Start: 2021-04-28 — End: ?

## 2021-04-29 MED ORDER — IBUPROFEN 800 MG PO TABS
800 mg | ORAL_TABLET | Freq: Three times a day (TID) | ORAL | 0 refills | Status: AC | PRN
Start: 2021-04-29 — End: ?

## 2021-06-03 MED ORDER — IBUPROFEN 800 MG PO TABS
800 mg | ORAL_TABLET | Freq: Three times a day (TID) | ORAL | 0 refills | PRN
Start: 2021-06-03 — End: ?

## 2021-06-18 ENCOUNTER — Non-Acute Institutional Stay: Payer: PRIVATE HEALTH INSURANCE

## 2021-06-20 ENCOUNTER — Non-Acute Institutional Stay: Payer: BLUE CROSS/BLUE SHIELD | Attending: Rheumatology

## 2021-06-25 ENCOUNTER — Telehealth: Payer: PRIVATE HEALTH INSURANCE

## 2021-06-25 ENCOUNTER — Ambulatory Visit: Payer: BLUE CROSS/BLUE SHIELD

## 2021-06-25 DIAGNOSIS — M47816 Spondylosis without myelopathy or radiculopathy, lumbar region: Secondary | ICD-10-CM

## 2021-06-25 MED ORDER — IBUPROFEN 600 MG PO TABS
600 mg | ORAL_TABLET | Freq: Three times a day (TID) | ORAL | 3 refills | Status: AC | PRN
Start: 2021-06-25 — End: ?

## 2021-06-25 MED ORDER — METHOCARBAMOL 750 MG PO TABS
750 mg | ORAL_TABLET | Freq: Three times a day (TID) | ORAL | 0 refills | Status: AC | PRN
Start: 2021-06-25 — End: ?

## 2021-06-25 NOTE — Telephone Encounter
Patients insurance will be ending this year and she wants to schedule a nerve block and an abrasion before her insurance ends this year. Please call the patient as soon as possible to schedule the nerve block first before we can schedule her appt here with Dr. Harrie Foreman.

## 2021-06-25 NOTE — Progress Notes
[facility]  Department of Anesthesiology & Pain Medicine        Date of Service: 06/25/2021  Primary Care Physician: Linton Ham., MD      Chief Complaint:   Lower Back Pain    History of Present Illness:   35 y.o. female with a past medical history significant for Bipolar depression, anxiety, alcohol use disorder, opioid use disorder (per chart review), bulimia, anorexia, Sjogren's syndrome, chronic neck and low back pain who presents for further evaluation and possible management of neck and low back pain. The patient reports she has had chronic low back pain for many years which began after a gymnastic injury. Today her pain is located in his Left low back over her PSIS/L5 facet joint into her buttock. She also complains of pain in her neck into her trapezius muscles, right periscapular area. She also has chronic wrist and ankle pain. The pain is not associated with numbness, tingling, or weakness. The patient has had pain refractory to NSAIDs, APAP, topicals, medrol dose pack, gabapentin, muscle relaxants, chiropractor and physical therapy. She was referred to our clinic for possible interventional treatment options.     Interval History:   35 y.o.  female with a PMHX significant for Bipolar depression, anxiety, alcohol use disorder, opioid use disorder (per chart review), bulimia, anorexia, Sjogren's syndrome, chronic neck and low back pain presenting for continued management of low back pain. The patient reports her low back pain and chronic pain in general has worsened over the past months. Today her major pain complaint is located in her left lumbar low back with primarily left side pain. She rates this pain as a 7/10. The patient reports she had 90% relief for 8 hours with her first set of blocks.  The patient denies any new numbness, tingling, or weakness. The patient denies any loss of bowel or bladder function. No fevers, chills, night sweats, sores or sores. She would like to schedule her next set of nerve blocks.      Current treatment:   Baclofen 10 mg at bedtime - not effective   Gabapentin 100 mg TID  Clonazepam   Quetiapine   Ibuprofen   ?  Chiropractor: twice weekly , adjustments  Acupuncture: effective at E/W, since changed not as effective     Injection History:  06/04/20: L3, L4, L5 medial nerve branch blocks - 80% improvement   ?  Prior Treatment:                                                                                                                                                      ?         Tried                Efficacy    NSAIDS Y Ibuprofen -  effective   Tylenol Y ?   Opioids N ?   Gabapentin Y Effective   Lyrica N ?   TCA/SSRI/SNRI N ?   Muscle Relaxants Y Baclofen, flexeril - unsure if effective   Other: N ?   Topicals Y Tiger balm    CBD/Medrol Dose Pack Y Medrol: effective   CSI/Epidural Steroids Y -Facet intra-articular injections - no benefit  -CESI   Nerve Block/RFA N ?   Chiropractor  Acupuncture N Chiropractor: effective, mild  Acupuncture: e/w medicine, cupping/TPI - was effective   Physical Therapy Y -Multiple Sessions - land therapy.  -last in Jan 2020, 6 sessions   ?   ?        MEDICATIONS:    Current Outpatient Medications   Medication Sig   ? albuterol 90 mcg/act inhaler inhale 2 puffs 5-30 minutes prior to exercise.   ? ALBUTEROL 90 mcg/act inhaler INHALE 2 PUFFS BY MOUTH EVERY 6 HOURS AS NEEDED   ? baclofen 5 mg tablet Take 1 tablet (5 mg total) by mouth two (2) times daily as needed.   ? clonazePAM 0.5 mg tablet    ? clonazePAM 1 mg tablet Take 1 mg by mouth two (2) times daily.   ? gabapentin 100 mg capsule TAKE 1 CAPSULE BY MOUTH THREE TIMES A DAY   ? ibuprofen 600 mg tablet Take 1 tablet (600 mg total) by mouth every eight (8) hours as needed (pain) . If you are needing to use this frequently/regularly for pain or headache, please have visit with healthcare provider to discuss further management options..   ? IBUPROFEN 800 mg tablet TAKE 1 TABLET (800 MG TOTAL) BY MOUTH THREE (3) TIMES DAILY AS NEEDED (PAIN).   ? Licorice, Glycyrrhiza glabra, (LICORICE PO) Take by mouth.   ? mirtazapine 15 mg tablet    ? Misc Natural Products (DANDELION ROOT PO) Take by mouth.   ? multivitamin tablet Take by mouth daily.   ? QUEtiapine 50 mg tablet    ? UNABLE TO FIND Citrigen .   ? VRAYLAR 1.5 MG capsule      No current facility-administered medications for this visit.            Physical Examination:   *part of exam* referred from prior visit    Alert & oriented to person, place, time, event Yes    MOOD/AFFECT: Normal mood and affect        Affect/Mood: appropriate  Pulm:  Breathing non-labored  Heart:  Regular rate   ?  Gait: Gait is anantalgic.     -The patient is able to ambulate without assistance   SPINE:  Palpation at the midline cervical - sacral level reveals no tenderness  ?  Range of Motion:  ? Lumbar Presence of Pain   Flexion Full Y   Extension Limited, mild Y   ?  ?  Upper Extremity Motor:  C5-T1 Deltoid Bicep Tricep WE WF HG   RUE 5 5 5 5 5 5    LUE 5 5 5 5 5 5    ?  ?  Lower Extremity Motor:  L2-S1 Hip Flex KE KF AT EHL  PF   RLE 5 5 5 5 5 5    LLE 5 5 5 5 5 5    ?  ?  Sensation (light touch):                 Right Upper Extremity: normal  Left Upper Extremity: normal                 Right Lower Extremity: normal                 Left Lower Extremity: normal  ?  Provocative Tests Result   Lumbar Facet Loading +, L   SLR supine -   Patrick/FABER -   Scour's -   SIJ tenderness with palpation -   Trochanteric bursa tenderness -                    ?  TPs:   + palpable lumbar paraspinals TP  ?  ?  Reflexes:  DTR R L   Babinski NEG NEG   Clonus NEG NEG   Hoffman's NEG NEG   ?  ?  Skin:  No scars in examined area. No rashes/suspicious skin lesions.  Extremities No edema present      Imaging Studies:     2019 Lumbar MRI:   ?  For the purposes of counting, note that the S1 vertebral body is reticular in confirmation, and there is a well-formed S1-S2 intervertebral disc. Mild disc degeneration is seen at L5-S1. The L1 vertebra demonstrates a lumbar-type right-sided transverse   process; however, there appears to be a small articulating left-sided transverse process.  ?  Distal spinal cord, conus medullaris, and cauda equina are unremarkable. ?  ?  T12-L1: No stenosis  L1-2: No stenosis  L2-3: No stenosis  L3-4: No stenosis  L4-5: Trace bulging. No stenosis.  L5-S1: Mild bulging small central protrusion and moderate to severe left as well as mild right facet hypertrophy with mild left foraminal narrowing.   ?  No epidural collection or mass is seen in the spinal canal. Mild edema is seen at the severely hypertrophied left L5-S1 facet joint.  ?  ?  IMPRESSION:  ?  See above regarding counting methodology. Mild disc degeneration is seen at L5-S1 with disc bulging and small superimposed central protrusion.   Severe left L5-S1 facet hypertrophy results in minimal left foraminal narrowing. Trace edematous changes are noted associated with the facet joint which may correspond to symptoms of pain.  No high-grade foraminal or canal stenosis at any level.  ?  2019 Cervical MRI:                                                                Mild C5-6 disc degeneration is present. Alignment, vertebral body heights, and marrow signal are within normal limits. The spinal cord has normal signal, caliber and contour.  ?  C2-3: No stenosis  C3-4: No stenosis  C4-5: No stenosis  C5-6: Disc/osteophyte complex and right more than left uncovertebral spurring with minimal right foraminal narrowing.  C6-7: No stenosis  C7-T1: No stenosis  ?  No epidural collection or mass is seen in the spinal canal. Paraspinal soft tissues are without significant abnormality.  ?  ?  IMPRESSION:  ?  Mild C5-6 disc degeneration with right parietal uncovertebral spurring resulting in minimal right foraminal narrowing. No significant foraminal or canal stenosis is seen at any level. ?        Assessment:    1. Severe L L5/S1 facet arthropathy  2. Myofascial Pain  3.  Lumbar spondylosis w/L5-S1 DDD       Impression:    35 y.o.  female with a PMHX significant for Bipolar depression, anxiety, alcohol use disorder, opioid use disorder (per chart review), bulimia, anorexia, Sjogren's syndrome, chronic neck and low back pain presenting for continued management of low back pain.  Their history and physical is consistent with a possible diagnosis of lumbar facet mediated pain. We discussed treatment options and I recommended the patient a repeat trial of lumbar medial nerve branch blocks. We discussed risks/benefits of the procedure including but not limited to infection, bleeding, treatment failure, worsening pain, & COVID related risks, and the patient agreed to proceed.               Treatment Recommendations:   1) Medical Modalities:  -d/c baclofen  -trial of robaxin PRN  -Ibuprofen refilled  2) Interventional Modalities:   -Repeat L L3-5 medial nerve branch blocks  We discussed risks/benefits of the procedure including but not limited to infection, bleeding, treatment failure, worsening pain, & COVID related risks, and the patient agreed to proceed.            3) Behavioral Medicine Modalities: None  4) Imaging: None  5) Other Modalities: Continue PT/HEP    Lonzo Candy, MD  Assistant Clinical Professor  Interventional Spine & Pain Medicine   Department of Anesthesiology  [facility] [place], [place]

## 2021-06-27 NOTE — Telephone Encounter
Spoke with patient and advised her that the Berkley Harvey is currently pending and I will call her back to schedule once approved.

## 2021-07-09 NOTE — Telephone Encounter
Left VM for patient to call back and schedule their procedure ordered by Dr.Tondravi.     - Vanessa Jensen

## 2021-07-17 ENCOUNTER — Ambulatory Visit: Payer: BLUE CROSS/BLUE SHIELD

## 2021-07-30 ENCOUNTER — Non-Acute Institutional Stay: Payer: BLUE CROSS/BLUE SHIELD

## 2021-08-05 ENCOUNTER — Ambulatory Visit: Payer: PRIVATE HEALTH INSURANCE | Attending: Rheumatology

## 2021-08-05 DIAGNOSIS — M3501 Sicca syndrome with keratoconjunctivitis: Secondary | ICD-10-CM
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
## 2021-08-08 ENCOUNTER — Telehealth: Payer: BLUE CROSS/BLUE SHIELD

## 2021-08-08 NOTE — Telephone Encounter
Call Back Request      Reason for call back: Pt following up on medical clearance for detox center. PLEASE SEE ATTACHED ENCOUNTER     Any Symptoms:  []  Yes  []  No       If yes, what symptoms are you experiencing: sweating ,shaking,vomiting  o Duration of symptoms (how long):  2 weeks  o Have you taken medication for symptoms (OTC or Rx):      If call was taken outside of clinic hours:    [] Patient or caller has been notified that this message was sent outside of normal clinic hours.     [] Patient or caller has been warm transferred to the physician's answering service. If applicable, patient or caller informed to please call back if symptoms progress.  Patient or caller has been notified of the turnaround time of 1-2 business day(s).

## 2021-08-08 NOTE — Telephone Encounter
Call Back Request      Reason for call back: Pt called stating that she needs a medical clearance to go into a detox center. She needs the clearance to be admitted.      Any Symptoms:  []  Yes  []  No       If yes, what symptoms are you experiencing:    o Duration of symptoms (how long):    o Have you taken medication for symptoms (OTC or Rx):      If call was taken outside of clinic hours:    [] Patient or caller has been notified that this message was sent outside of normal clinic hours.     [] Patient or caller has been warm transferred to the physician's answering service. If applicable, patient or caller informed to please call back if symptoms progress.  Patient or caller has been notified of the turnaround time of 1-2 business day(s).

## 2021-08-09 MED ORDER — ALBUTEROL SULFATE HFA 108 (90 BASE) MCG/ACT IN AERS
2 refills | 25.00000 days
Start: 2021-08-09 — End: ?

## 2021-08-09 NOTE — Telephone Encounter
Hi Dr.,    Please advise. Thank you

## 2021-08-13 MED ORDER — ALBUTEROL SULFATE HFA 108 (90 BASE) MCG/ACT IN AERS
0 refills | Status: AC
Start: 2021-08-13 — End: ?

## 2021-08-20 NOTE — Telephone Encounter
Called and lvm to inform patient.   Left number to reach me.

## 2021-09-05 MED ORDER — ALBUTEROL SULFATE HFA 108 (90 BASE) MCG/ACT IN AERS
2.00 refills | 25.00000 days
Start: 2021-09-05 — End: ?

## 2021-09-09 MED ORDER — ALBUTEROL SULFATE HFA 108 (90 BASE) MCG/ACT IN AERS
1 refills | 25.00000 days | Status: AC
Start: 2021-09-09 — End: ?

## 2022-07-14 MED ORDER — ALBUTEROL SULFATE HFA 108 (90 BASE) MCG/ACT IN AERS
2 | Freq: Four times a day (QID) | RESPIRATORY_TRACT | PRN
Start: 2022-07-14 — End: ?

## 2022-07-15 MED ORDER — ALBUTEROL SULFATE HFA 108 (90 BASE) MCG/ACT IN AERS
2 | Freq: Four times a day (QID) | RESPIRATORY_TRACT | 0 refills | Status: AC | PRN
Start: 2022-07-15 — End: 2022-07-30

## 2022-07-29 MED ORDER — ALBUTEROL SULFATE HFA 108 (90 BASE) MCG/ACT IN AERS
2 | Freq: Four times a day (QID) | RESPIRATORY_TRACT | 0 refills | Status: AC | PRN
Start: 2022-07-29 — End: ?

## 2023-12-31 DIAGNOSIS — F1093 Alcohol withdrawal syndrome without complication (HCC/RAF): Secondary | ICD-10-CM

## 2023-12-31 NOTE — ED Notes
 Charlcie Conger: pt's friend [phone]  Cary Clarks: pt's mom [phone]

## 2023-12-31 NOTE — ED Notes
 The patient was identified as facing housing insecurity. As per ZO1096 policy, the patient was referred to the [facility] Coordinator.    HCC met with the patient who reports she entered the detox program today at [facility] in [facility], but does not know how she will get back. Pt has an intake detox form.     HCC contacted [facility] at [phone], and Chalice Colt (staff) confirmed the patient as a detox client.      Pt and RN were provided with contact information below for pick-up once medically cleared.     [facility] contacts for pick up:    - Daejah at [phone]   - Roger at [phone]
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
## 2023-12-31 NOTE — ED Provider Notes
 [facility]  Emergency Department Service Report    Vanessa Jensen 38 y.o. female , presents with Withdrawal    Triage   Arrived on 12/31/2023 at 4:58 PM   Arrived by Walk-in [14]    ED Triage Vitals   Temp Temp Source BP Heart Rate Resp SpO2 O2 Device Pain Score Weight   12/31/23 1702 12/31/23 1702 12/31/23 1702 12/31/23 1702 12/31/23 1702 12/31/23 1702 12/31/23 1707 12/31/23 1707 12/31/23 1707   36.9 ?C (98.4 ?F) Oral 119/78 (!) 139 19 96 % None (Room air) Eight 49.9 kg (110 lb)       Pre hospital care:       Allergies   Allergen Reactions    Clindamycin     Nitrofurantoin     Tea      Green tea    Fluconazole Hives    Metronidazole Hives    Sulfa Antibiotics Hives         Initial Physician Contact       Initial Contact Completed?: Yes (12/31/23 1726)      History   HPI   Vanessa Jensen is a 38 y.o. female, with history of Alcohol use disorder, Bipolar disorder, who presents to the ED with alcohol withdrawal. Last drink was at 0300 today. Had been drinking about half a handle for the last week. Tried to check herself into [facility] but they did not have any beds. Complains of nausea and associated vomiting with flecks of blood earlier today. Actively withdrawing per patient.              Past Medical History:   Diagnosis Date    Asthma     Bipolar II disorder (HCC/RAF)     Chronic fatigue, unspecified     CIN III (cervical intraepithelial neoplasia III)     s/p LEEP 12/16    Depression with anxiety     prior suicide attempt 2006 by substance overdose    History of alcohol abuse     h/o alchohol withdrawls    History of eating disorder     History of pancreatitis 2001    History of substance abuse (HCC/RAF)     HPV in female 2005    Insomnia     Migraines     PTSD (post-traumatic stress disorder)     Sjogren's syndrome     Urticaria         Past Surgical History:   Procedure Laterality Date    CERVICAL BIOPSY  W/ LOOP ELECTRODE EXCISION  2006, 07/19/2015    DILATION AND CURETTAGE OF UTERUS  08/2013    HX COLPOSCOPY OF THE CERVIX  08/2014    TOE SURGERY  08/2014    broken toe with torn ligaments in 08/2014 and dislocated ulna/wrist tear in 03/2015      WRIST SURGERY  05/2016    dislocated ulna, torn tendon        Past Family History   family history includes Alcohol abuse in her maternal grandfather, maternal grandmother, and paternal grandfather; Arthritis in her mother; Asthma in her father; Bipolar disorder in her father and mother; Cancer in her maternal grandfather, maternal grandmother, and paternal grandfather; Crohn's disease in her sister; Depression in her father; Hyperlipidemia in her father; Hypertension in her father; Stroke in her father; Substance Abuse in her father.                 Past Social History   she reports that she has been  smoking cigarettes. She started smoking about 18 years ago. She has a 6.2 pack-year smoking history. She has never used smokeless tobacco. She reports current alcohol use. She reports current drug use. Drug: Marijuana. She reports being sexually active and has had partner(s) who are female. She reports using the following method of birth control/protection: Condom.       Physical Exam   Physical Exam  Vitals and nursing note reviewed.   Constitutional:       General: She is not in acute distress.     Appearance: Normal appearance.   HENT:      Head: Normocephalic and atraumatic.      Nose: Nose normal.      Mouth/Throat:      Pharynx: Oropharynx is clear.   Eyes:      Extraocular Movements: Extraocular movements intact.      Conjunctiva/sclera: Conjunctivae normal.      Pupils: Pupils are equal, round, and reactive to light.   Cardiovascular:      Rate and Rhythm: Regular rhythm. Tachycardia present.      Pulses: Normal pulses.   Pulmonary:      Effort: Pulmonary effort is normal. No respiratory distress.      Breath sounds: Normal breath sounds.   Abdominal:      General: Abdomen is flat.      Palpations: Abdomen is soft.      Tenderness: There is no abdominal tenderness. There is no guarding or rebound.   Musculoskeletal:         General: Normal range of motion.      Cervical back: Normal range of motion and neck supple.   Lymphadenopathy:      Cervical: No cervical adenopathy.   Skin:     General: Skin is warm and dry.      Capillary Refill: Capillary refill takes less than 2 seconds.   Neurological:      Mental Status: She is alert. Mental status is at baseline.      Sensory: No sensory deficit.   Psychiatric:         Behavior: Behavior normal.      Comments: No SI or HI         ED Course     ED Course as of 01/01/24 0049   Thu Dec 31, 2023   1956 EKG at 1951  Sinus rhythm  Rate 90  Qtc 396 [BC]   2310 Tachycardia improved, phenobarb x 2, ativan, stable for dc with librium taper [BC]   Fri Jan 01, 2024   0045 Transfer back to rehab with Rx: librium  [BC]      ED Course User Index  [BC] Avanell Bob., DO       Laboratory Results     Labs Reviewed   CBC - Abnormal; Notable for the following components:       Result Value    White Blood Cell Count 9.96 (*)     All other components within normal limits   BASIC METABOLIC PANEL - Abnormal; Notable for the following components:    Total CO2 18 (*)     Anion Gap 21 (*)     All other components within normal limits   HEPATIC FUNCT PANEL - Abnormal; Notable for the following components:    Aspartate Aminotransferase 71 (*)     All other components within normal limits   UA,DIPSTICK - Abnormal; Notable for the following components:    Blood 2+ (*)  Ketones 1+ (*)     Protein 1+ (*)     All other components within normal limits   UA,MICROSCOPIC - Abnormal; Notable for the following components:    RBC per uL 123 (*)     RBC per HPF 22 (*)     Squamous Epi Cells 24 (*)     All other components within normal limits   URINALYSIS W/REFLEX TO CULTURE    Narrative:     The following orders were created for panel order Urinalysis w/Reflex to Culture (ED Dysuria/Flank Pain: Nurse Protocol).  Procedure Abnormality         Status                     ---------                               -----------         ------                     UA,Dipstick[[id number]]                  Abnormal            Final result               UA,Microscopic[[id number]]               Abnormal            Final result                 Please view results for these tests on the individual orders.   PREGNANCY TEST URINE, POC   POCT URINE PREGNANCY   POCT URINALYSIS DIPSTICK       Imaging Results     No orders to display       Administered Medications     Medication Administration from 12/31/2023 1658 to 01/01/2024 0049         Date/Time Order Dose Route Action Action by Comments     12/31/2023 1930 PDT sodium chloride 0.9% IV soln bolus 500 mL 0 mL Intravenous Stopped Dysangco, Lark Plum, RN --     12/31/2023 1747 PDT sodium chloride 0.9% IV soln bolus 500 mL 500 mL Intravenous New Bag/ Syringe/ Cartridge Lenox Raider., RN --     12/31/2023 1747 PDT PHENobarbital 130 mg/mL inj 260 mg 260 mg IV Push Given Lenox Raider., RN --     12/31/2023 2017 PDT ondansetron 4 mg/2 mL inj 4 mg 4 mg Intravenous Given Dysangco, Janelle Renee, RN --     12/31/2023 2021 PDT PHENobarbital 130 mg/mL inj 130 mg 130 mg IV Push Given Dysangco, Lark Plum, RN --     01/01/2024 0021 PDT sodium chloride 0.9% IV soln bolus 1,000 mL 0 mL Intravenous Stopped Chantal Comment, RN --     12/31/2023 2053 PDT sodium chloride 0.9% IV soln bolus 1,000 mL 1,000 mL Intravenous New Bag/ Syringe/ Cartridge Sherol Dixie, RN --     12/31/2023 2210 PDT LORazepam 2 mg/mL inj 2 mg 2 mg IV Push Given Dysangco, Janelle Renee, RN --     01/01/2024 0028 PDT LORazepam 2 mg/mL inj 1 mg 1 mg IV Push Given Andrade, Harriette Tovey Ninozka, RN --     01/01/2024 0029 PDT sodium chloride 0.9% IV soln bolus 1,000 mL 1,000 mL Intravenous New Bag/ Syringe/ Cartridge [place], [place]  Ninozka, RN --     01/01/2024 0029 PDT ondansetron 4 mg/2 mL inj 4 mg 4 mg Intravenous Given Andrade, Syriana Croslin Ninozka, RN --            PHENobarbital 130 mg/mL inj 260 mg, PHENobarbital 130 mg/mL inj 130 mg, LORazepam 2 mg/mL inj 2 mg, LORazepam 2 mg/mL inj 1 mg is a High-risk medication as it is a parenteral controlled substance.          Procedures   Procedural Sedation  Procedures    Medical Decision Making   Vanessa Jensen is a 38 y.o. female presents to the ED with signs and symptoms of etoh withdrawal. Tachycardia. Will check labs, start phenobarbital , ivf, zofran, reassess    SW consult for homeless care resources    Medical Decision Making    Launch MDCalc MDM Tool   Click the link above to launch the MDCalc MDM tool. Save the tool results and then refresh the note (lower-left corner or [id number]). The MDM tool results must be imported into the note to sign it.    Problems  Clinical Impressions Complexity of problems addressed         Alcohol withdrawal syndrome without complication (HCC/RAF) (Primary) High:  [x]  Acute/chronic illness/injury with threat to life to bodily function  []  Chronic illness with severe exacerbation, progression, or side effects of treatment    Moderate:  []  Undiagnosed new problem with uncertain prognosis  []  Acute illness with systemic symptoms  []  Acute complicated injury    Data and Risk  Independent Historian []  Parent as child too young to provide hx []  Family/Caregiver due to AMS/dementia []  EMS due to medical acuity/trauma []  Family/EMS due to behavioral health concern and for collateral []    External Data Reviewed previous workup and mgmt of patient's Withdrawal via  []  Previous [place] Notes/Labs/Imaging []  External Notes/Labs/Imaging  which were non-contributory unless documented otherwise in HPI and ED Course   Considered but decided against  []  CT Head/C-spine given [place] CT/NEXUS/PECARN criteria []  CTA Chest to r/o PE given PERC/Well's criteria []  CT AP to r/o appendicitis given PAS score or family discussion []  Hospitalization due to *  []    Discussed w/ ext HCP []  Consults, PCP/outpt specialists, nursing home. See ED Course for details.   SDOH Affecting Dx/Tx []  Insurance limiting specialist referral []  Housing instability limiting outpt mgmt   []  Financial insecurity limiting medication access []  Substance/ETOH use []    Care de-escalation []  Shared decision-making regarding de-escalation of care (e.x. DNR) or foregoing hospitalization-level of care due to patient wishes/goals of care   Interpretations See ED Course. []     If applicable, parenteral controlled substances, drug therapies requiring intensive monitoring for toxicity, and prescription drug management are documented in the the Medications section of this note. If applicable, major and minor procedures are documented separately in Procedure Notes.    Clinical Impression           Alcohol withdrawal syndrome without complication (HCC/RAF) (Primary)      Prescriptions     New Prescriptions    CHLORDIAZEPOXIDE 25 MG CAPSULE    Two capsules three times a day for 1 day, then 1 capsule three times a day for 1 day, then 1 capsule two times a day for 1 day, then one capsule a day for 1 day.    MICONAZOLE 2% CREAM    Apply topically two (2) times daily.       Disposition and Follow-up   Disposition:  Discharge [1]     No future appointments.    Follow up with:  Marvel Slicker, MD  [address]  [address]  [place] [address]  [phone]            Return precautions are specified on After Visit Summary.        Scribe Signature   I, Shery Done, have acted as a Stage manager for [employer] on behalf of Dr. Isaias March on 12/31/2023 at 5:02 PM. All documentation underwent a comprehensive review by the listed physician(s) and received their approval upon signing.    Physician Signature(s)                  Malcom Scriver J., DO  01/01/24 [phone]

## 2023-12-31 NOTE — ED Notes
 Assisted pt to bedpan

## 2023-12-31 NOTE — Discharge Instructions
Emergency Department Discharge Instructions      Summary of your visit  You have been evaluated in the [facility] Emergency Department today for possible early alcohol withdrawal  Please take your prescribed Librium as directed. DO NOT drink alcohol or drive while taking Librium.     Follow-Up  Please follow up with your primary care physician within three days after being discharged from the ER - you can call to schedule an appointment.  You can find a primary care physician at [facility] by calling [phone].    If you do not have a Primary Care Physician, please call your insurance company or you may call [phone] to establish care with a [facility] physician.  If you are uninsured, please call 2-1-1 to find a free or low-cost clinic in your area. 2-1-1 [place] is the central source for providing information and referrals for all health and human services in [facility] [place]. Our 2-1-1 phone line is open 24 hours, 7 days a week, with trained [employer] prepared to offer help with any situation, any time. Our community services go far beyond phone referrals - explore our website to learn more. If you are calling from outside [facility] or cannot directly dial 2-1-1, you can call [phone].      Return to the Emergency Department if you experience:  Rapid heart rate/palpitations  Seizures  Sudden weakness on one side of the body or sudden trouble speaking  If you can't keep down fluids or food  Heavy bleeding or vomiting blood  Trouble breathing or slow, irregular breathing  You Kra[place], become very drowsy, or have trouble awakening  Chest pain  Any other concerning symptoms     Thank you for choosing [facility] for your care. It was a pleasure taking part in your care today, and we wish you the best!

## 2024-01-01 ENCOUNTER — Inpatient Hospital Stay
Admit: 2024-01-01 | Discharge: 2024-01-01 | Disposition: A | Discharge status: Rehabilitation Facility | Payer: BLUE CROSS/BLUE SHIELD | Source: Home / Self Care

## 2024-01-01 LAB — Pregnancy Test Urine, POC: PREG TEST URINE, POC: NEGATIVE

## 2024-01-01 LAB — Basic Metabolic Panel
ANION GAP: 21 mmol/L — ABNORMAL HIGH (ref 8–19)
CALCIUM: 8.7 mg/dL (ref 8.6–10.4)

## 2024-01-01 LAB — CBC: NUCLEATED RBC%, AUTOMATED: 0 % (ref 9.3–13.0)

## 2024-01-01 LAB — UA,Dipstick: GLUCOSE: NEGATIVE (ref 1.005–1.030)

## 2024-01-01 LAB — Hepatic Funct Panel: ALANINE AMINOTRANSFERASE: 48 U/L (ref 8–70)

## 2024-01-01 LAB — UA,Microscopic: RBCS HPF: 22 {cells}/[HPF] — ABNORMAL HIGH (ref 0–2)

## 2024-01-01 MED ORDER — CHLORDIAZEPOXIDE HCL 25 MG PO CAPS
ORAL_CAPSULE | ORAL | 0 refills | 4.00000 days | Status: AC
Start: 2024-01-01 — End: 2024-01-01

## 2024-01-01 MED ORDER — MICONAZOLE NITRATE 2 % EX CREA
Freq: Two times a day (BID) | TOPICAL | 0 refills | 14.00000 days | Status: AC
Start: 2024-01-01 — End: ?

## 2024-01-01 MED ORDER — CHLORDIAZEPOXIDE HCL 25 MG PO CAPS
ORAL_CAPSULE | ORAL | 0 refills | Status: AC
Start: 2024-01-01 — End: ?

## 2024-01-01 MED ADMIN — LORAZEPAM 2 MG/ML IJ SOLN: 1 mg | INTRAVENOUS | @ 07:00:00 | Stop: 2024-01-01 | NDC 00641604401

## 2024-01-01 MED ADMIN — ONDANSETRON HCL 4 MG/2ML IJ SOLN: 4 mg | INTRAVENOUS | @ 03:00:00 | Stop: 2024-01-01 | NDC 60505613000

## 2024-01-01 MED ADMIN — SODIUM CHLORIDE 0.9 % IV BOLUS: 1000 mL | INTRAVENOUS | @ 04:00:00 | Stop: 2024-01-01 | NDC 00338004904

## 2024-01-01 MED ADMIN — PHENOBARBITAL SODIUM 130 MG/ML IJ SOLN: 130 mg | INTRAVENOUS | @ 03:00:00 | Stop: 2024-01-01 | NDC 00641047721

## 2024-01-01 MED ADMIN — SODIUM CHLORIDE 0.9 % IV BOLUS: 1000 mL | INTRAVENOUS | @ 07:00:00 | Stop: 2024-01-01 | NDC 00338004904

## 2024-01-01 MED ADMIN — SODIUM CHLORIDE 0.9 % IV BOLUS: 500 mL | INTRAVENOUS | @ 01:00:00 | Stop: 2024-01-01 | NDC 00338004903

## 2024-01-01 MED ADMIN — ONDANSETRON HCL 4 MG/2ML IJ SOLN: 4 mg | INTRAVENOUS | @ 07:00:00 | Stop: 2024-01-01 | NDC 60505613000

## 2024-01-01 MED ADMIN — PHENOBARBITAL SODIUM 130 MG/ML IJ SOLN: 260 mg | INTRAVENOUS | @ 01:00:00 | Stop: 2024-01-01 | NDC 00641047721

## 2024-01-01 MED ADMIN — LORAZEPAM 2 MG/ML IJ SOLN: 2 mg | INTRAVENOUS | @ 05:00:00 | Stop: 2024-01-01 | NDC 00641604401

## 2024-01-01 NOTE — ED Notes
Pt tolerated PO food well.

## 2024-01-01 NOTE — ED Notes
 Contact Roger from [employer]; stated he will be on his way to pick up pt.
